# Patient Record
Sex: Female | Born: 1955 | Race: White | Hispanic: No | Smoking: Current some day smoker
Health system: Southern US, Community
[De-identification: ages and names within clinical notes are randomized; demographics above are authoritative.]

## PROBLEM LIST (undated history)

## (undated) DIAGNOSIS — R079 Chest pain, unspecified: Secondary | ICD-10-CM

## (undated) DIAGNOSIS — K219 Gastro-esophageal reflux disease without esophagitis: Secondary | ICD-10-CM

## (undated) DIAGNOSIS — E785 Hyperlipidemia, unspecified: Secondary | ICD-10-CM

## (undated) DIAGNOSIS — K760 Fatty (change of) liver, not elsewhere classified: Secondary | ICD-10-CM

## (undated) DIAGNOSIS — G43909 Migraine, unspecified, not intractable, without status migrainosus: Secondary | ICD-10-CM

## (undated) DIAGNOSIS — F419 Anxiety disorder, unspecified: Secondary | ICD-10-CM

## (undated) DIAGNOSIS — I10 Essential (primary) hypertension: Secondary | ICD-10-CM

## (undated) DIAGNOSIS — F32A Depression, unspecified: Secondary | ICD-10-CM

## (undated) DIAGNOSIS — F329 Major depressive disorder, single episode, unspecified: Secondary | ICD-10-CM

## (undated) DIAGNOSIS — N200 Calculus of kidney: Secondary | ICD-10-CM

## (undated) DIAGNOSIS — J449 Chronic obstructive pulmonary disease, unspecified: Secondary | ICD-10-CM

## (undated) HISTORY — DX: Major depressive disorder, single episode, unspecified: F32.9

## (undated) HISTORY — PX: TUBAL LIGATION: SHX77

## (undated) HISTORY — DX: Anxiety disorder, unspecified: F41.9

## (undated) HISTORY — PX: CORONARY ANGIOPLASTY: SHX604

## (undated) HISTORY — PX: EYE SURGERY: SHX253

## (undated) HISTORY — DX: Hyperlipidemia, unspecified: E78.5

## (undated) HISTORY — DX: Migraine, unspecified, not intractable, without status migrainosus: G43.909

## (undated) HISTORY — DX: Chest pain, unspecified: R07.9

## (undated) HISTORY — DX: Calculus of kidney: N20.0

## (undated) HISTORY — DX: Depression, unspecified: F32.A

## (undated) HISTORY — DX: Gastro-esophageal reflux disease without esophagitis: K21.9

## (undated) HISTORY — PX: TONSILLECTOMY: SUR1361

---

## 1998-07-15 ENCOUNTER — Ambulatory Visit (HOSPITAL_COMMUNITY): Admission: RE | Admit: 1998-07-15 | Discharge: 1998-07-15 | Payer: Self-pay | Admitting: Neurosurgery

## 2001-07-25 ENCOUNTER — Emergency Department (HOSPITAL_COMMUNITY): Admission: EM | Admit: 2001-07-25 | Discharge: 2001-07-25 | Payer: Self-pay | Admitting: Emergency Medicine

## 2001-07-25 ENCOUNTER — Encounter: Payer: Self-pay | Admitting: Emergency Medicine

## 2001-07-25 ENCOUNTER — Encounter: Payer: Self-pay | Admitting: *Deleted

## 2001-12-10 ENCOUNTER — Ambulatory Visit (HOSPITAL_COMMUNITY): Admission: RE | Admit: 2001-12-10 | Discharge: 2001-12-10 | Payer: Self-pay | Admitting: Family Medicine

## 2001-12-10 ENCOUNTER — Encounter: Payer: Self-pay | Admitting: Family Medicine

## 2002-01-06 ENCOUNTER — Encounter: Payer: Self-pay | Admitting: Family Medicine

## 2002-01-06 ENCOUNTER — Emergency Department (HOSPITAL_COMMUNITY): Admission: RE | Admit: 2002-01-06 | Discharge: 2002-01-06 | Payer: Self-pay | Admitting: Family Medicine

## 2002-03-04 ENCOUNTER — Encounter: Payer: Self-pay | Admitting: Urology

## 2002-03-04 ENCOUNTER — Ambulatory Visit (HOSPITAL_COMMUNITY): Admission: RE | Admit: 2002-03-04 | Discharge: 2002-03-04 | Payer: Self-pay | Admitting: Urology

## 2002-03-13 ENCOUNTER — Other Ambulatory Visit: Admission: RE | Admit: 2002-03-13 | Discharge: 2002-03-13 | Payer: Self-pay | Admitting: Dermatology

## 2002-07-08 ENCOUNTER — Ambulatory Visit (HOSPITAL_COMMUNITY): Admission: RE | Admit: 2002-07-08 | Discharge: 2002-07-08 | Payer: Self-pay | Admitting: Internal Medicine

## 2002-07-08 HISTORY — PX: COLONOSCOPY: SHX174

## 2003-04-10 ENCOUNTER — Encounter: Payer: Self-pay | Admitting: Family Medicine

## 2003-04-10 ENCOUNTER — Ambulatory Visit (HOSPITAL_COMMUNITY): Admission: RE | Admit: 2003-04-10 | Discharge: 2003-04-10 | Payer: Self-pay | Admitting: Family Medicine

## 2004-10-28 ENCOUNTER — Ambulatory Visit (HOSPITAL_COMMUNITY): Admission: RE | Admit: 2004-10-28 | Discharge: 2004-10-28 | Payer: Self-pay | Admitting: Family Medicine

## 2008-11-18 ENCOUNTER — Ambulatory Visit (HOSPITAL_COMMUNITY): Admission: RE | Admit: 2008-11-18 | Discharge: 2008-11-18 | Payer: Self-pay | Admitting: Family Medicine

## 2008-11-30 ENCOUNTER — Ambulatory Visit (HOSPITAL_COMMUNITY): Admission: RE | Admit: 2008-11-30 | Discharge: 2008-11-30 | Payer: Self-pay | Admitting: Family Medicine

## 2009-05-01 ENCOUNTER — Observation Stay (HOSPITAL_COMMUNITY): Admission: EM | Admit: 2009-05-01 | Discharge: 2009-05-03 | Payer: Self-pay | Admitting: Emergency Medicine

## 2009-05-10 ENCOUNTER — Ambulatory Visit (HOSPITAL_COMMUNITY): Admission: RE | Admit: 2009-05-10 | Discharge: 2009-05-10 | Payer: Self-pay | Admitting: Internal Medicine

## 2009-05-11 ENCOUNTER — Encounter (INDEPENDENT_AMBULATORY_CARE_PROVIDER_SITE_OTHER): Payer: Self-pay | Admitting: *Deleted

## 2009-07-14 ENCOUNTER — Ambulatory Visit: Payer: Self-pay | Admitting: Gastroenterology

## 2009-07-14 ENCOUNTER — Encounter: Payer: Self-pay | Admitting: Internal Medicine

## 2009-07-14 DIAGNOSIS — R0789 Other chest pain: Secondary | ICD-10-CM | POA: Insufficient documentation

## 2009-07-14 DIAGNOSIS — R1319 Other dysphagia: Secondary | ICD-10-CM | POA: Insufficient documentation

## 2009-07-14 DIAGNOSIS — K5909 Other constipation: Secondary | ICD-10-CM | POA: Insufficient documentation

## 2009-07-14 DIAGNOSIS — K222 Esophageal obstruction: Secondary | ICD-10-CM | POA: Insufficient documentation

## 2009-07-14 DIAGNOSIS — K219 Gastro-esophageal reflux disease without esophagitis: Secondary | ICD-10-CM | POA: Insufficient documentation

## 2009-07-15 ENCOUNTER — Encounter: Payer: Self-pay | Admitting: Internal Medicine

## 2009-07-21 ENCOUNTER — Ambulatory Visit: Payer: Self-pay | Admitting: Internal Medicine

## 2009-07-21 ENCOUNTER — Ambulatory Visit (HOSPITAL_COMMUNITY): Admission: RE | Admit: 2009-07-21 | Discharge: 2009-07-21 | Payer: Self-pay | Admitting: Internal Medicine

## 2009-07-21 HISTORY — PX: ESOPHAGOGASTRODUODENOSCOPY: SHX1529

## 2009-07-23 ENCOUNTER — Telehealth (INDEPENDENT_AMBULATORY_CARE_PROVIDER_SITE_OTHER): Payer: Self-pay

## 2009-09-17 ENCOUNTER — Ambulatory Visit (HOSPITAL_COMMUNITY): Admission: RE | Admit: 2009-09-17 | Discharge: 2009-09-17 | Payer: Self-pay | Admitting: Internal Medicine

## 2009-12-28 ENCOUNTER — Encounter (INDEPENDENT_AMBULATORY_CARE_PROVIDER_SITE_OTHER): Payer: Self-pay | Admitting: *Deleted

## 2010-01-06 ENCOUNTER — Ambulatory Visit: Payer: Self-pay | Admitting: Internal Medicine

## 2010-04-14 ENCOUNTER — Encounter: Payer: Self-pay | Admitting: Gastroenterology

## 2010-05-16 ENCOUNTER — Encounter: Payer: Self-pay | Admitting: Urgent Care

## 2011-01-24 NOTE — Letter (Signed)
Summary: Appointment Reminder  [facility]  [address]   [place], [place] [address]   Phone: [phone]  Fax: [phone]       December 28, 2009   Lorraine Wells [address] [place], [place]  [address] June 08, 1956    Dear [name],  We have been unable to reach you by phone to schedule a follow up   appointment that was recommended for you by Dr. Jena Gauss. It is very   important that we reach you to schedule an appointment. We hope that you  allow [place] to participate in your health care needs. Please contact us at  [phone] at your earliest convenience to schedule your appointment.  Sincerely,    [facility] R. Roetta Sessions, M.D.    Kassie Mends, M.D. Lorenza Burton, FNP-BC    Tana Coast, PA-C Phone: [phone]    Fax: [phone]

## 2011-01-24 NOTE — Medication Information (Signed)
Summary: Tax adviser   Imported By: Diana Eves 05/16/2010 11:52:30  _____________________________________________________________________  External Attachment:    Type:   Image     Comment:   External Document  Appended Document: RX FolderZEGERID    Prescriptions: ZEGERID 40-1100 MG CAPS (OMEPRAZOLE-SODIUM BICARBONATE) 1 by mouth before supper daily for GERD  #31 x 11   Entered and Authorized by:   Joselyn Arrow FNP-BC   Signed by:   Joselyn Arrow FNP-BC on 05/16/2010   Method used:   Electronically to        [facility] (retail)       [address] [place], [place]  [address]       Ph: [phone]       Fax: [phone]   RxID:   [id number]

## 2011-01-24 NOTE — Medication Information (Signed)
Summary: zegerid PA  zegerid PA   Imported By: Hendricks Limes LPN 16/09/9603 54:09:81  _____________________________________________________________________  External Attachment:    Type:   Image     Comment:   External Document

## 2011-01-24 NOTE — Assessment & Plan Note (Signed)
Summary: pp fu in 6 months/gerd/CM   Visit Type:  Follow-up Visit Primary Care Provider:  Mcgough  Chief Complaint:  follow up- doing ok.  History of Present Illness: 6 month followup EGD with Maloney dilation of a Schatzki's ring. [name] is been doing fairly well and Dexalant 60 mg daily  although she's is awakened from a sound sleep with nocturnal regurgitation on the order of once weekly;  patient states she is going to bed on an empty stomach.; she has gained 4 pounds since her last visit. She has gained 4 pounds. She continues to smoke.    Current Problems (verified): 1)  Constipation, Chronic  (ICD-564.09) 2)  Chest Pain, Atypical  (ICD-786.59) 3)  Schatzki's Ring  (ICD-530.3) 4)  Gerd  (ICD-530.81) 5)  Dysphagia  (ZOX-096.04)  Current Medications (verified): 1)  Norvasc 5 Mg Tabs (Amlodipine Besylate) .... Take 1 Tablet By Mouth Once A Day 2)  Hydrochlorothiazide 25 Mg Tabs (Hydrochlorothiazide) .... Take 1 Tablet By Mouth Once A Day 3)  Singulair 10 Mg Tabs (Montelukast Sodium) .... Take 1 Tablet By Mouth Once A Day 4)  Proair .... As Needed 5)  Dexilant 60 Mg Cpdr (Dexlansoprazole) .... One By Mouth Daily 30 Mins Before Breakfast  Allergies (verified): No Known Drug Allergies  Past History:  Past Medical History: Last updated: 07/14/2009 Asthma Depression GERD Hyperlipidemia Hypertension Kidney Stones Migraines Recent Cath for chest pain, unremarkable. TCS, Dr. Jena Gauss 2003, int hemorrhoids Bell's Palsy, H/O?  Past Surgical History: Last updated: 07/14/2009 Tonsillectomy Tubal Ligation  Family History: Last updated: 07/14/2009 Paternal Aunt - age 73, colon cancer  Social History: Last updated: 07/14/2009 Significant other of 13 years [employer] 1 son, 97 y/o 1ppd since age 72 Alcohol Use - no Illicit Drug Use - no  Vital Signs:  Patient profile:   55 year old female Height:      62 inches Weight:      146  pounds BMI:     26.80 Temp:     98.0 degrees F oral Pulse rate:   84 / minute BP sitting:   108 / 80  (left arm) Cuff size:   regular  Vitals Entered By: Hendricks Limes LPN (January 06, 2010 4:12 PM)  Physical Exam  General:  alert conversant no acute distress detailed exam deferred  Impression & Recommendations: Impression: A 55 year old lady with long-standing gastroesophageal reflux disease.  Dysphagia resolved status post dilation Schatzki's ring. She has significant breakthrough symptoms about once weekly.  I reviewed antireflux lifestyle/diet with her today in the office Recommendations:  stop Dexalant begin Zegerid 40 mg once daily just before suppertime.  Recommend smoking cessation. She will let us know via telephone how she's doing in a month with this new drug  - we will go from there. Plan to see her back in the office in one year.  Other Orders: Est. Patient Level III (54098)

## 2011-04-04 LAB — URINALYSIS, ROUTINE W REFLEX MICROSCOPIC
Bilirubin Urine: NEGATIVE
Glucose, UA: NEGATIVE mg/dL
Ketones, ur: NEGATIVE mg/dL
Nitrite: NEGATIVE
Specific Gravity, Urine: 1.013 (ref 1.005–1.030)
pH: 6.5 (ref 5.0–8.0)

## 2011-04-04 LAB — CBC
HCT: 40.4 % (ref 36.0–46.0)
Hemoglobin: 13.1 g/dL (ref 12.0–15.0)
Platelets: 261 10*3/uL (ref 150–400)
RBC: 4.71 MIL/uL (ref 3.87–5.11)
RDW: 15.2 % (ref 11.5–15.5)
WBC: 7.9 10*3/uL (ref 4.0–10.5)

## 2011-04-04 LAB — COMPREHENSIVE METABOLIC PANEL
ALT: 36 U/L — ABNORMAL HIGH (ref 0–35)
Alkaline Phosphatase: 81 U/L (ref 39–117)
BUN: 10 mg/dL (ref 6–23)
Creatinine, Ser: 0.75 mg/dL (ref 0.4–1.2)
Glucose, Bld: 99 mg/dL (ref 70–99)
Sodium: 142 mEq/L (ref 135–145)
Total Bilirubin: 0.7 mg/dL (ref 0.3–1.2)

## 2011-04-04 LAB — CARDIAC PANEL(CRET KIN+CKTOT+MB+TROPI)
CK, MB: 0.9 ng/mL (ref 0.3–4.0)
CK, MB: 1 ng/mL (ref 0.3–4.0)
Total CK: 35 U/L (ref 7–177)
Total CK: 36 U/L (ref 7–177)
Troponin I: 0.01 ng/mL (ref 0.00–0.06)

## 2011-04-04 LAB — LIPID PANEL: Triglycerides: 130 mg/dL (ref <150)

## 2011-04-04 LAB — DIFFERENTIAL
Basophils Relative: 1 % (ref 0–1)
Lymphs Abs: 2.7 10*3/uL (ref 0.7–4.0)
Monocytes Absolute: 0.4 10*3/uL (ref 0.1–1.0)
Monocytes Relative: 6 % (ref 3–12)
Neutrophils Relative %: 52 % (ref 43–77)

## 2011-04-04 LAB — PROTIME-INR
INR: 0.9 (ref 0.00–1.49)
Prothrombin Time: 12.7 seconds (ref 11.6–15.2)

## 2011-04-04 LAB — CK TOTAL AND CKMB (NOT AT ARMC): CK, MB: 0.8 ng/mL (ref 0.3–4.0)

## 2011-04-04 LAB — BASIC METABOLIC PANEL
BUN: 13 mg/dL (ref 6–23)
CO2: 26 mEq/L (ref 19–32)
Calcium: 9.3 mg/dL (ref 8.4–10.5)
Potassium: 3.4 mEq/L — ABNORMAL LOW (ref 3.5–5.1)
Sodium: 142 mEq/L (ref 135–145)

## 2011-04-04 LAB — TSH: TSH: 1.694 u[IU]/mL (ref 0.350–4.500)

## 2011-04-27 ENCOUNTER — Encounter (HOSPITAL_COMMUNITY): Payer: Self-pay

## 2011-04-27 ENCOUNTER — Other Ambulatory Visit (HOSPITAL_COMMUNITY): Payer: Self-pay | Admitting: Family Medicine

## 2011-04-27 ENCOUNTER — Ambulatory Visit (HOSPITAL_COMMUNITY)
Admission: RE | Admit: 2011-04-27 | Discharge: 2011-04-27 | Disposition: A | Discharge status: Home / Self Care | Payer: 59 | Source: Ambulatory Visit | Attending: Family Medicine | Admitting: Family Medicine

## 2011-04-27 DIAGNOSIS — Z01419 Encounter for gynecological examination (general) (routine) without abnormal findings: Secondary | ICD-10-CM

## 2011-04-27 DIAGNOSIS — R05 Cough: Secondary | ICD-10-CM | POA: Insufficient documentation

## 2011-04-27 DIAGNOSIS — F17200 Nicotine dependence, unspecified, uncomplicated: Secondary | ICD-10-CM | POA: Insufficient documentation

## 2011-04-27 DIAGNOSIS — R059 Cough, unspecified: Secondary | ICD-10-CM | POA: Insufficient documentation

## 2011-04-27 DIAGNOSIS — J45909 Unspecified asthma, uncomplicated: Secondary | ICD-10-CM | POA: Insufficient documentation

## 2011-04-27 DIAGNOSIS — Z139 Encounter for screening, unspecified: Secondary | ICD-10-CM

## 2011-04-27 HISTORY — DX: Essential (primary) hypertension: I10

## 2011-05-02 ENCOUNTER — Ambulatory Visit (HOSPITAL_COMMUNITY)
Admission: RE | Admit: 2011-05-02 | Discharge: 2011-05-02 | Disposition: A | Discharge status: Home / Self Care | Payer: 59 | Source: Ambulatory Visit | Attending: Family Medicine | Admitting: Family Medicine

## 2011-05-02 DIAGNOSIS — Z139 Encounter for screening, unspecified: Secondary | ICD-10-CM

## 2011-05-02 DIAGNOSIS — Z1231 Encounter for screening mammogram for malignant neoplasm of breast: Secondary | ICD-10-CM | POA: Insufficient documentation

## 2011-05-09 NOTE — Discharge Summary (Signed)
NAMEMARRIETTA, THUNDER NO.:  [id number]   MEDICAL RECORD NO.:  [id number]          PATIENT TYPE:  INP   LOCATION:  3735                         FACILITY:  [facility]   PHYSICIAN:  Antonieta Iba, MD   DATE OF BIRTH:  January 02, 1956   DATE OF ADMISSION:  05/01/2009  DATE OF DISCHARGE:  05/03/2009                               DISCHARGE SUMMARY   REASON FOR PROCEDURE:  Ms. Lorraine Wells is a pleasant 55 year old  woman with a lifetime history of smoking, hyperlipidemia, hypertension  who presents with chest discomfort with radiation.  She was evaluated in  the emergency room by Dr. Lynnea Ferrier who scheduled her for cardiac  catheterization, given her the risk factors.  She has had a negative  stress test in the past.   PROCEDURE DETAILS:  Risks and benefits of the procedure were discussed  with the patient and consent was obtained.  She was brought to the  cardiac catheterization lab and prepped and draped in the usual sterile  fashion.  The modified Seldinger technique was used to engage the  femoral artery.  A 5-French sheath was placed.  A 5-French Judkins left  #4 and a right #4 catheter were used to engage the left main and the RCA  ostium respectively.  Hand injections with contrast with cinematography  was used to record the coronary anatomy.  A 5-French pigtail catheter  was used across the aortic valve to the left ventricle and LV gram was  recorded.  The sheath was removed at the end of the case and manual  pressure was held and hemostasis obtained.  No complications were noted  at the time of this dictation.   Coronary anatomy:   Left main:  Left main is moderate-to-large size vessel that bifurcates  into the LAD and left circumflex.  There is no significant disease  noted.   Left anterior descending:  The LAD is a moderate-to-large size vessel  that extends to the apex.  There is a high diagonal branch, possibly a  ramus branch.  There is also another  proximal-to-mid smaller diagonal  branch that is bifurcating.  There is no significant disease noted in  the LAD or the diagonal vessels.   Left circumflex:  The left circumflex is a moderate-to-large size vessel  with 3 obtuse marginal branches.  There is no significant disease noted.   Right coronary artery:  RCA is a either co-dominant or non-dominant  vessel with a moderate size marginal branch and moderate sized PL with  small PDA.  There is no significant disease noted.   LV gram notable for ejection fraction greater than 55% with no focal  wall motion abnormalities.  No aortic stenosis, trace mitral  regurgitation, and no significant aortic insufficiency.   In summary, co-dominant coronary artery system or right dominant system  with no significant coronary artery disease, ejection fraction estimated  at greater than 55% with no focal wall motion abnormalities and no  significant  aortic stenosis or aortic insufficiency with trace mitral regurgitation.  Etiology of the patient's chest pain is uncertain and likely atypical  with chest  pain given to her normal coronary anatomy.  She will followup  in the clinic for routine post catheterization check within the next  week to 10 days.      Antonieta Iba, MD  Electronically Signed     TJG/MEDQ  D:  05/03/2009  T:  05/04/2009  Job:  [id number]

## 2011-05-09 NOTE — Op Note (Signed)
NAME:  Lorraine Wells, Lorraine Wells             ACCOUNT NO.:  [id number]   MEDICAL RECORD NO.:  [id number]          PATIENT TYPE:  AMB   LOCATION:  DAY                           FACILITY:  [facility]   PHYSICIAN:  R. Roetta Sessions, M.D. DATE OF BIRTH:  [birth year]   DATE OF PROCEDURE:  07/21/2009  DATE OF DISCHARGE:                                PROCEDURE NOTE   Esophagogastroduodenoscopy with [name] dilation, biopsy disruption of  Schatzki's ring.   INDICATIONS FOR PROCEDURE:  A 55 -year-old lady with long history of  gastroesophageal reflux disease, now with some breakthrough symptoms,  taking Protonix 40 mg before she goes to bed nightly.  She also  describes esophageal dysphagia to solids.  Recent barium esophagram  demonstrated nonobstructing Schatzki's ring.  EGD now being done.  Risks, benefits, alternatives and limitations have been discussed.  Potential for esophageal dilation reviewed.  Please see documentation in  medical record.   PROCEDURE NOTE:  O2 saturation, blood pressure, pulse, respirations were  monitored throughout entire procedure.   CONSCIOUS SEDATION:  Versed 5 mg IV, Demerol 100 grams IV in divided  doses.   INSTRUMENT:  Pentax video chip system.   FINDINGS:  Examination of the tubular esophagus revealed a Schatzki's  ring and a single 4-mm distal esophageal erosion.  There was no  Barrett's esophagus.  The EG junction was widely patent, although ring  was very much present.  EG junction easily traversed with scope.   Stomach:  Gastric cavity was empty, insufflated well with air.  Thorough  examination of the gastric mucosa including retroflexion in the proximal  stomach and esophagogastric junction demonstrated a small hiatal hernia.  She had 1-2 mm fundal gland polyps, multiple, which were not  manipulated.  Otherwise gastric mucosa appeared normal.  Pylorus was  patent, easily traversed.  Examination of the bulb and second portion  revealed no abnormalities.   THERAPEUTIC/DIAGNOSTIC MANEUVERS PERFORMED:  Scope was withdrawn.  A 56-  French Maloney dilator was passed to full insertion with ease.  A look  back revealed the ring remained intact after passage of the dilator.  Subsequently 2 quadrant bites of the ring with gentle biopsy forceps  disrupted this ring nicely without apparent complications.  There was  minimal bleeding.  The patient tolerated the procedure well and as  reactive in endoscopy.   IMPRESSION:  1. Schatzki's ring status post dilation disruption as described above.  2. Single distal esophageal erosion consistent with mild erosive      reflux esophagitis; otherwise unremarkable esophagus.  3. Small hiatal hernia.  4. Multiple fundal gland type polyps not manipulated; otherwise normal      gastric mucosa.  5. Patent pylorus.  6. Normal D1, D2.   RECOMMENDATIONS:  1. Gastroesophageal reflux disease and hiatal hernia literature      provided to Ms. Merilynn Finland.  2. Increase Protonix to 40 grams orally twice daily, i.e. before      supper and breakfast for 3 months, thereafter drop back to Protonix      once daily before supper.   I plan to see this nice lady back in  6 months.      Jonathon Bellows, M.D.  Electronically Signed     RMR/MEDQ  D:  07/21/2009  T:  07/21/2009  Job:  [id number]   cc:   Kirk Ruths, M.D.  Fax: [phone]

## 2011-05-09 NOTE — Discharge Summary (Signed)
NAMERAYNISHA, AVILLA NO.:  [id number]   MEDICAL RECORD NO.:  [id number]          PATIENT TYPE:  INP   LOCATION:  3735                         FACILITY:  [facility]   PHYSICIAN:  Ritta Slot, MD     DATE OF BIRTH:  Dec 14, 1956   DATE OF ADMISSION:  05/01/2009  DATE OF DISCHARGE:  05/03/2009                               DISCHARGE SUMMARY   DISCHARGE DIAGNOSES:  1. Chest pain, normal coronaries at catheterization this admission.  2. History of smoking.  3. Treated hypertension.  4. Dyslipidemia.   HOSPITAL COURSE:  The patient is a 55 year old female who was sent down  from Dr. Sharyon Medicus office with chest pain.  She had some minor nonspecific  ST changes.  She does have risk factors for coronary disease.  She was  admitted to telemetry and CK-MB troponins were obtained.  She was put on  beta-blocker and aspirin and set up for diagnostic catheterization which  was done today, May 03, 2009.  Her catheterization shows essentially  normal coronaries.   LABORATORY DATA:  Other labs show an LDL of 123, cholesterol of 180,  hemoglobin A1c of 5.9.  White count 7.9, hemoglobin 13.9, hematocrit  40.4, and platelets 261.  Sodium 142, potassium 3.4, BUN 15, and  creatinine 0.9.  Chest x-ray shows no active lung disease with some  hyperaeration.   DISPOSITION:  The patient is discharged in stable condition.  She will  follow up with Dr. Lynnea Ferrier in [place] in about a week.   DISCHARGE MEDICATIONS:  1. Hydrochlorothiazide 12.5 mg a day.  2. Norvasc 5 mg a day.  3. Protonix 40 mg twice a day.  4. Singulair 10 mg a day.  5. Albuterol p.r.n.   DISCHARGE INSTRUCTIONS:  She has been instructed to stop smoking.  We  will try and get the smoking cessation counselor to see her before  discharge.      Abelino Derrick, P.A.      Ritta Slot, MD  Electronically Signed    LKK/MEDQ  D:  05/03/2009  T:  05/04/2009  Job:  [id number]   cc:   Madelin Rear. Sherwood Gambler, MD

## 2011-05-12 NOTE — Op Note (Signed)
[facility]  Patient:    Lorraine Wells, Lorraine Wells Visit Number: [id number] MRN: [id number]          Service Type: END Location: DAY Attending Physician:  Jonathon Bellows Dictated by:   Roetta Sessions, M.D. Proc. Date: 07/08/02 Admit Date:  07/08/2002 Discharge Date: 07/08/2002   CC:         Colette Ribas, M.D.   Operative Report  PROCEDURE:  Diagnostic colonoscopy.  INDICATIONS:  The patient is a 55 year old lady with recent abdominal cramps and hematochezia which resolved.  This occurred for a couple of days approximately two weeks ago.  She now comes for colonoscopy.  She currently is devoid of any lower GI tract symptoms and moves her bowels daily.  She has not seen any more blood per rectum.  There is no family history of colorectal cancer.  She does smoke.  Colonoscopy is now being done to further evaluate her symptoms.  This approach has been discussed with the patient at length.  Potential risks, benefits, and alternatives have been reviewed with questions answered.  Please see my July 03, 2002, dictation for more information.  GASTROENTEROLOGIST:  Roetta Sessions, M.D.  PROCEDURE NOTE:  O2 saturation, blood pressure, pulses of this patient were monitored throughout the entire procedure.  CONSCIOUS SEDATION:  Versed 4 mg IV, Demerol 100 mg IV in divided doses.  INSTRUMENT: Olympus pediatric colonoscope.  FINDINGS:  Digital rectal examination revealed no abnormalities.  ENDOSCOPIC FINDINGS:  Prep was good.  RECTUM:  Examination of rectal mucosa including retroflexed view of the anal verge revealed minimal internal hemorrhoids.  Rectal mucosa otherwise appeared normal.  COLON:  Colonic mucosa was surveyed from the rectosigmoid junction through the left transverse right colon to the area of the appendiceal orifice, ileocecal valve, and cecum.  These structures were well seen and photographed.  Although there were some difficulties with  the light source not being as bright as it should be, mucosa was seen well, and the colonic mucosa all the way to the cecum appeared normal.  From the level of the cecum and ileocecal valve, the scope was slowly withdrawn.  All previously mentioned mucosal surfaces were again seen.  Again, no other abnormalities were observed.  The patient tolerated the procedure well and was reacted in endoscopy.  IMPRESSION: 1. Minimal internal hemorrhoids. 2. Otherwise normal rectum. 3. Normal colon.  I expect this patient has suffered a self-limiting bout of ischemic or segmental colitis.  RECOMMENDATIONS: 1. No specific further evaluation is needed unless she has recurrent symptoms. 2. The patient should stop smoking. 3. Unless something comes up in the future, she should consider returning    in 10 years for screening colonoscopy. Dictated by:   Roetta Sessions, M.D. Attending Physician:  Jonathon Bellows DD:  07/08/02 TD:  07/10/02 Job: [id number]

## 2011-12-12 ENCOUNTER — Encounter: Payer: Self-pay | Admitting: Internal Medicine

## 2012-06-20 LAB — COMPREHENSIVE METABOLIC PANEL
Albumin: 4.3
BUN: 11 mg/dL (ref 4–21)
Calcium: 9.4 mg/dL
Creat: 0.81

## 2012-06-20 LAB — CBC WITH DIFFERENTIAL/PLATELET
HCT: 39 %
WBC: 6.2
platelet count: 280

## 2012-07-24 ENCOUNTER — Other Ambulatory Visit (HOSPITAL_COMMUNITY): Payer: Self-pay | Admitting: Family Medicine

## 2012-07-24 DIAGNOSIS — Z139 Encounter for screening, unspecified: Secondary | ICD-10-CM

## 2012-07-24 DIAGNOSIS — Z01419 Encounter for gynecological examination (general) (routine) without abnormal findings: Secondary | ICD-10-CM

## 2012-07-25 ENCOUNTER — Ambulatory Visit (HOSPITAL_COMMUNITY)
Admission: RE | Admit: 2012-07-25 | Discharge: 2012-07-25 | Disposition: A | Discharge status: Home / Self Care | Payer: 59 | Source: Ambulatory Visit | Attending: Family Medicine | Admitting: Family Medicine

## 2012-07-25 DIAGNOSIS — Z01419 Encounter for gynecological examination (general) (routine) without abnormal findings: Secondary | ICD-10-CM

## 2012-07-25 DIAGNOSIS — Z1231 Encounter for screening mammogram for malignant neoplasm of breast: Secondary | ICD-10-CM | POA: Insufficient documentation

## 2012-07-25 DIAGNOSIS — Z139 Encounter for screening, unspecified: Secondary | ICD-10-CM

## 2012-07-29 ENCOUNTER — Telehealth: Payer: Self-pay

## 2012-07-29 NOTE — Telephone Encounter (Signed)
(   Last colonoscopy was 07/08/2002 and was normal). LMOM to call.

## 2012-07-30 ENCOUNTER — Telehealth: Payer: Self-pay | Admitting: *Deleted

## 2012-07-30 NOTE — Telephone Encounter (Signed)
Lorraine Wells received a letter to schedule a colonoscopy. Please call her back. Thanks.

## 2012-07-30 NOTE — Telephone Encounter (Signed)
Pt scheduled for OV with Tana Coast, PA on 08/05/2012 @ 8:00 AM for abdominal pain.

## 2012-08-02 ENCOUNTER — Encounter: Payer: Self-pay | Admitting: Internal Medicine

## 2012-08-05 ENCOUNTER — Encounter: Payer: Self-pay | Admitting: Gastroenterology

## 2012-08-05 ENCOUNTER — Ambulatory Visit (INDEPENDENT_AMBULATORY_CARE_PROVIDER_SITE_OTHER): Payer: 59 | Admitting: Gastroenterology

## 2012-08-05 VITALS — BP 140/90 | HR 84 | Temp 98.1°F | Ht 62.5 in | Wt 149.0 lb

## 2012-08-05 DIAGNOSIS — K219 Gastro-esophageal reflux disease without esophagitis: Secondary | ICD-10-CM

## 2012-08-05 DIAGNOSIS — Z1211 Encounter for screening for malignant neoplasm of colon: Secondary | ICD-10-CM

## 2012-08-05 DIAGNOSIS — K5909 Other constipation: Secondary | ICD-10-CM

## 2012-08-05 DIAGNOSIS — R1013 Epigastric pain: Secondary | ICD-10-CM

## 2012-08-05 MED ORDER — PEG-KCL-NACL-NASULF-NA ASC-C 100 G PO SOLR: 1.0000 | ORAL | Status: DC

## 2012-08-05 NOTE — Assessment & Plan Note (Signed)
4 week history of epigastric discomfort on a daily basis. No significant NSAID use. She takes a daily aspirin. She is on chronic the Zegerid for GERD with good control of typical heartburn symptoms. Symptoms are occurring postprandially especially after lunch which is her biggest meal of the day. Gallbladder remains in situ. I have offered her an upper endoscopy for further evaluation. If this is negative, consider gallbladder workup.   I have discussed the risks, alternatives, benefits with regards to but not limited to the risk of reaction to medication, bleeding, infection, perforation and the patient is agreeable to proceed. Written consent to be obtained.

## 2012-08-05 NOTE — Assessment & Plan Note (Signed)
Chronic constipation, managed with daily fiber. No other significant symptoms. Due for screening colonoscopy. She has a family history of colon cancer in a second-degree relative greater than age of 44, therefore this does not increase her personal risk of colon cancer.  I have discussed the risks, alternatives, benefits with regards to but not limited to the risk of reaction to medication, bleeding, infection, perforation and the patient is agreeable to proceed. Written consent to be obtained.

## 2012-08-05 NOTE — Patient Instructions (Signed)
We have scheduled you for an upper endoscopy and colonoscopy with Dr. Rourk. Please see separate instructions.  

## 2012-08-05 NOTE — Progress Notes (Signed)
Faxed to PCP

## 2012-08-05 NOTE — Progress Notes (Signed)
Primary Care Physician:  GOLDING,JOHN CABOT, MD  Primary Gastroenterologist:  Michael Rourk, MD   Chief Complaint  Patient presents with  . Colonoscopy  . EGD    HPI:  Lorraine Wells is a 56 y.o. female here to schedule her screening colonoscopy. She also has a four week h/o epigastric discomfort almost daily. Seems to be worse after lunch. Biggest meal of day. No n/v. Some occasional heartburn. No recent change in her PPI. BM irregular. Added fiber, powder. No melena, brbpr. No weight loss. Occasional ibuprofen for headache but not regularly. No dysphagia.   Current Outpatient Prescriptions  Medication Sig Dispense Refill  . amLODipine (NORVASC) 5 MG tablet Take 5 mg by mouth daily.       . aspirin 81 MG tablet Take 81 mg by mouth daily.      . clonazePAM (KLONOPIN) 0.5 MG tablet Take 0.5 mg by mouth 2 (two) times daily as needed.       . hydrochlorothiazide (HYDRODIURIL) 25 MG tablet Take 25 mg by mouth daily.       . montelukast (SINGULAIR) 10 MG tablet Take 10 mg by mouth at bedtime.       . PROAIR HFA 108 (90 BASE) MCG/ACT inhaler Inhale 1 puff into the lungs every 4 (four) hours as needed.       . simvastatin (ZOCOR) 10 MG tablet Take 10 mg by mouth at bedtime.       . ZEGERID 40-1100 MG per capsule Take 1 capsule by mouth daily before breakfast.         Allergies as of 08/05/2012  . (No Known Allergies)    Past Medical History  Diagnosis Date  . Hypertension   . Asthma   . Anxiety   . Kidney stones   . Depression   . GERD (gastroesophageal reflux disease)   . Migraine   . Hyperlipidemia     Past Surgical History  Procedure Date  . Colonoscopy 07/08/02    minimal internal hemorrhoids/otherwise normal  . Esophagogastroduodenoscopy 07/21/09    schatzis ring s/p dilation;small HH/mild erosive reflux/patent pylorus  . Coronary angioplasty   . Tubal ligation   . Tonsillectomy     Family History  Problem Relation Age of Onset  . Colon cancer Paternal Aunt 66  .  Liver disease Neg Hx   . Inflammatory bowel disease Neg Hx     History   Social History  . Marital Status: Divorced    Spouse Name: N/A    Number of Children: 1  . Years of Education: N/A   Occupational History  .  [employer]   Social History Main Topics  . Smoking status: Current Some Day Smoker -- 0.3 packs/day    Types: Cigarettes  . Smokeless tobacco: Not on file  . Alcohol Use: No  . Drug Use: No  . Sexually Active: Not on file   Other Topics Concern  . Not on file   Social History Narrative  . No narrative on file      ROS:  General: Negative for anorexia, weight loss, fever, chills, fatigue, weakness. Eyes: Negative for vision changes.  ENT: Negative for hoarseness, difficulty swallowing , nasal congestion. CV: Negative for chest pain, angina, palpitations, dyspnea on exertion, peripheral edema.  Respiratory: Negative for dyspnea at rest, dyspnea on exertion, cough, sputum, wheezing.  GI: See history of present illness. GU:  Negative for dysuria, hematuria, urinary incontinence, urinary frequency, nocturnal urination.  MS: Negative for joint pain, low   back pain.  Derm: Negative for rash or itching.  Neuro: Negative for weakness, abnormal sensation, seizure, frequent headaches, memory loss, confusion.  Psych: Negative for anxiety, depression, suicidal ideation, hallucinations.  Endo: Negative for unusual weight change.  Heme: Negative for bruising or bleeding. Allergy: Negative for rash or hives.    Physical Examination:  BP 140/90  Pulse 84  Temp 98.1 F (36.7 C) (Temporal)  Ht 5' 2.5" (1.588 m)  Wt 149 lb (67.586 kg)  BMI 26.82 kg/m2   General: Well-nourished, well-developed in no acute distress.  Head: Normocephalic, atraumatic.   Eyes: Conjunctiva pink, no icterus. Mouth: Oropharyngeal mucosa moist and pink , no lesions erythema or exudate. Neck: Supple without thyromegaly, masses, or lymphadenopathy.  Lungs: Clear to auscultation  bilaterally.  Heart: Regular rate and rhythm, no murmurs rubs or gallops.  Abdomen: Bowel sounds are normal, nontender, nondistended, no hepatosplenomegaly or masses, no abdominal bruits or    hernia , no rebound or guarding.   Rectal: defer to time of TCS Extremities: No lower extremity edema. No clubbing or deformities.  Neuro: Alert and oriented x 4 , grossly normal neurologically.  Skin: Warm and dry, no rash or jaundice.   Psych: Alert and cooperative, normal mood and affect.   

## 2012-08-07 NOTE — Telephone Encounter (Signed)
Pt has had OV and is scheduled for colonoscopy on 08/27/2012 with RMR.

## 2012-08-16 ENCOUNTER — Encounter (HOSPITAL_COMMUNITY): Payer: Self-pay | Admitting: Pharmacy Technician

## 2012-08-23 MED ORDER — SODIUM CHLORIDE 0.9 % IV SOLN: INTRAVENOUS | Status: DC

## 2012-08-27 ENCOUNTER — Encounter (HOSPITAL_COMMUNITY)
Admission: RE | Disposition: A | Discharge status: Home Health | Payer: Self-pay | Source: Ambulatory Visit | Attending: Internal Medicine

## 2012-08-27 ENCOUNTER — Ambulatory Visit (HOSPITAL_COMMUNITY)
Admission: RE | Admit: 2012-08-27 | Discharge: 2012-08-27 | Disposition: A | Discharge status: Home Health | Payer: 59 | Source: Ambulatory Visit | Attending: Internal Medicine | Admitting: Internal Medicine

## 2012-08-27 ENCOUNTER — Encounter (HOSPITAL_COMMUNITY): Payer: Self-pay

## 2012-08-27 ENCOUNTER — Other Ambulatory Visit: Payer: Self-pay | Admitting: General Practice

## 2012-08-27 DIAGNOSIS — Z79899 Other long term (current) drug therapy: Secondary | ICD-10-CM | POA: Insufficient documentation

## 2012-08-27 DIAGNOSIS — D128 Benign neoplasm of rectum: Secondary | ICD-10-CM | POA: Insufficient documentation

## 2012-08-27 DIAGNOSIS — Z1211 Encounter for screening for malignant neoplasm of colon: Secondary | ICD-10-CM | POA: Insufficient documentation

## 2012-08-27 DIAGNOSIS — K449 Diaphragmatic hernia without obstruction or gangrene: Secondary | ICD-10-CM | POA: Insufficient documentation

## 2012-08-27 DIAGNOSIS — K5730 Diverticulosis of large intestine without perforation or abscess without bleeding: Secondary | ICD-10-CM | POA: Insufficient documentation

## 2012-08-27 DIAGNOSIS — I10 Essential (primary) hypertension: Secondary | ICD-10-CM | POA: Insufficient documentation

## 2012-08-27 DIAGNOSIS — D129 Benign neoplasm of anus and anal canal: Secondary | ICD-10-CM

## 2012-08-27 DIAGNOSIS — R109 Unspecified abdominal pain: Secondary | ICD-10-CM

## 2012-08-27 DIAGNOSIS — K5909 Other constipation: Secondary | ICD-10-CM

## 2012-08-27 DIAGNOSIS — D131 Benign neoplasm of stomach: Secondary | ICD-10-CM | POA: Insufficient documentation

## 2012-08-27 DIAGNOSIS — E785 Hyperlipidemia, unspecified: Secondary | ICD-10-CM | POA: Insufficient documentation

## 2012-08-27 DIAGNOSIS — R1013 Epigastric pain: Secondary | ICD-10-CM

## 2012-08-27 DIAGNOSIS — K219 Gastro-esophageal reflux disease without esophagitis: Secondary | ICD-10-CM

## 2012-08-27 HISTORY — PX: COLONOSCOPY: SHX5424

## 2012-08-27 HISTORY — PX: ESOPHAGOGASTRODUODENOSCOPY: SHX1529

## 2012-08-27 SURGERY — COLONOSCOPY WITH ESOPHAGOGASTRODUODENOSCOPY (EGD)
Anesthesia: Moderate Sedation

## 2012-08-27 MED ORDER — MIDAZOLAM HCL 5 MG/5ML IJ SOLN
INTRAMUSCULAR | Status: AC
  Filled 2012-08-27: qty 10

## 2012-08-27 MED ORDER — MEPERIDINE HCL 100 MG/ML IJ SOLN
INTRAMUSCULAR | Status: DC | PRN
  Administered 2012-08-27: 50 mg via INTRAVENOUS
  Administered 2012-08-27 (×2): 25 mg via INTRAVENOUS

## 2012-08-27 MED ORDER — SIMETHICONE 40 MG/0.6ML PO SUSP
ORAL | Status: DC | PRN
  Administered 2012-08-27: 11:00:00

## 2012-08-27 MED ORDER — BUTAMBEN-TETRACAINE-BENZOCAINE 2-2-14 % EX AERO
INHALATION_SPRAY | CUTANEOUS | Status: DC | PRN
  Administered 2012-08-27: 2 via TOPICAL

## 2012-08-27 MED ORDER — MIDAZOLAM HCL 5 MG/5ML IJ SOLN
INTRAMUSCULAR | Status: DC | PRN
  Administered 2012-08-27 (×2): 1 mg via INTRAVENOUS
  Administered 2012-08-27 (×2): 2 mg via INTRAVENOUS

## 2012-08-27 MED ORDER — SODIUM CHLORIDE 0.45 % IV SOLN
INTRAVENOUS | Status: DC
  Administered 2012-08-27: 1000 mL via INTRAVENOUS

## 2012-08-27 MED ORDER — MEPERIDINE HCL 100 MG/ML IJ SOLN
INTRAMUSCULAR | Status: AC
  Filled 2012-08-27: qty 2

## 2012-08-27 NOTE — Interval H&P Note (Signed)
History and Physical Interval Note:  08/27/2012 11:02 AM  Lorraine Wells  has presented today for surgery, with the diagnosis of GERD, EPIGASTRIC PAIN , CONSTIPATION  The various methods of treatment have been discussed with the patient and family. After consideration of risks, benefits and other options for treatment, the patient has consented to  Procedure(s) (LRB) with comments: COLONOSCOPY WITH ESOPHAGOGASTRODUODENOSCOPY (EGD) (N/A) - 10:30 as a surgical intervention .  The patient's history has been reviewed, patient examined, no change in status, stable for surgery.  I have reviewed the patient's chart and labs.  Questions were answered to the patient's satisfaction.     Eula Listen

## 2012-08-27 NOTE — H&P (View-Only) (Signed)
Primary Care Physician:  Colette Ribas, MD  Primary Gastroenterologist:  Roetta Sessions, MD   Chief Complaint  Patient presents with  . Colonoscopy  . EGD    HPI:  Lorraine Wells is a 56 y.o. female here to schedule her screening colonoscopy. She also has a four week h/o epigastric discomfort almost daily. Seems to be worse after lunch. Biggest meal of day. No n/v. Some occasional heartburn. No recent change in her PPI. BM irregular. Added fiber, powder. No melena, brbpr. No weight loss. Occasional ibuprofen for headache but not regularly. No dysphagia.   Current Outpatient Prescriptions  Medication Sig Dispense Refill  . amLODipine (NORVASC) 5 MG tablet Take 5 mg by mouth daily.       Marland Kitchen aspirin 81 MG tablet Take 81 mg by mouth daily.      . clonazePAM (KLONOPIN) 0.5 MG tablet Take 0.5 mg by mouth 2 (two) times daily as needed.       . hydrochlorothiazide (HYDRODIURIL) 25 MG tablet Take 25 mg by mouth daily.       . montelukast (SINGULAIR) 10 MG tablet Take 10 mg by mouth at bedtime.       Marland Kitchen PROAIR HFA 108 (90 BASE) MCG/ACT inhaler Inhale 1 puff into the lungs every 4 (four) hours as needed.       . simvastatin (ZOCOR) 10 MG tablet Take 10 mg by mouth at bedtime.       Marland Kitchen ZEGERID 40-1100 MG per capsule Take 1 capsule by mouth daily before breakfast.         Allergies as of 08/05/2012  . (No Known Allergies)    Past Medical History  Diagnosis Date  . Hypertension   . Asthma   . Anxiety   . Kidney stones   . Depression   . GERD (gastroesophageal reflux disease)   . Migraine   . Hyperlipidemia     Past Surgical History  Procedure Date  . Colonoscopy 07/08/02    minimal internal hemorrhoids/otherwise normal  . Esophagogastroduodenoscopy 07/21/09    schatzis ring s/p dilation;small HH/mild erosive reflux/patent pylorus  . Coronary angioplasty   . Tubal ligation   . Tonsillectomy     Family History  Problem Relation Age of Onset  . Colon cancer Paternal Aunt 35  .  Liver disease Neg Hx   . Inflammatory bowel disease Neg Hx     History   Social History  . Marital Status: Divorced    Spouse Name: N/A    Number of Children: 1  . Years of Education: N/A   Occupational History  .  [employer]   Social History Main Topics  . Smoking status: Current Some Day Smoker -- 0.3 packs/day    Types: Cigarettes  . Smokeless tobacco: Not on file  . Alcohol Use: No  . Drug Use: No  . Sexually Active: Not on file   Other Topics Concern  . Not on file   Social History Narrative  . No narrative on file      ROS:  General: Negative for anorexia, weight loss, fever, chills, fatigue, weakness. Eyes: Negative for vision changes.  ENT: Negative for hoarseness, difficulty swallowing , nasal congestion. CV: Negative for chest pain, angina, palpitations, dyspnea on exertion, peripheral edema.  Respiratory: Negative for dyspnea at rest, dyspnea on exertion, cough, sputum, wheezing.  GI: See history of present illness. GU:  Negative for dysuria, hematuria, urinary incontinence, urinary frequency, nocturnal urination.  MS: Negative for joint pain, low  back pain.  Derm: Negative for rash or itching.  Neuro: Negative for weakness, abnormal sensation, seizure, frequent headaches, memory loss, confusion.  Psych: Negative for anxiety, depression, suicidal ideation, hallucinations.  Endo: Negative for unusual weight change.  Heme: Negative for bruising or bleeding. Allergy: Negative for rash or hives.    Physical Examination:  BP 140/90  Pulse 84  Temp 98.1 F (36.7 C) (Temporal)  Ht 5' 2.5" (1.588 m)  Wt 149 lb (67.586 kg)  BMI 26.82 kg/m2   General: Well-nourished, well-developed in no acute distress.  Head: Normocephalic, atraumatic.   Eyes: Conjunctiva pink, no icterus. Mouth: Oropharyngeal mucosa moist and pink , no lesions erythema or exudate. Neck: Supple without thyromegaly, masses, or lymphadenopathy.  Lungs: Clear to auscultation  bilaterally.  Heart: Regular rate and rhythm, no murmurs rubs or gallops.  Abdomen: Bowel sounds are normal, nontender, nondistended, no hepatosplenomegaly or masses, no abdominal bruits or    hernia , no rebound or guarding.   Rectal: defer to time of TCS Extremities: No lower extremity edema. No clubbing or deformities.  Neuro: Alert and oriented x 4 , grossly normal neurologically.  Skin: Warm and dry, no rash or jaundice.   Psych: Alert and cooperative, normal mood and affect.

## 2012-08-27 NOTE — Op Note (Signed)
[facility] [address] [place], [address]   COLONOSCOPY PROCEDURE REPORT  PATIENT: Lorraine, Wells  MR#:         [id number] BIRTHDATE: 02/14/1956 , 56  yrs. old GENDER: Female ENDOSCOPIST: R.  Roetta Sessions, MD FACP FACG REFERRED BY:  Carmell Austria, M.D. PROCEDURE DATE:  08/27/2012 PROCEDURE:     colonoscopy with biopsy  INDICATIONS: average risk colorectal cancer screening  INFORMED CONSENT:  The risks, benefits, alternatives and imponderables including but not limited to bleeding, perforation as well as the possibility of a missed lesion have been reviewed.  The potential for biopsy, lesion removal, etc. have also been discussed.  Questions have been answered.  All parties agreeable. Please see the history and physical in the medical record for more information.  MEDICATIONS: Versed 6 mg IV and Demerol 100 mg IV in divided doses  DESCRIPTION OF PROCEDURE:  After a digital rectal exam was performed, the EG-[id number] ([id number]) and Pentax Colonoscope [id number] colonoscope was advanced from the anus through the rectum and colon to the area of the cecum, ileocecal valve and appendiceal orifice. The cecum was deeply intubated.  These structures were well-seen and photographed for the record.  From the level of the cecum and ileocecal valve, the scope was slowly and cautiously withdrawn. The mucosal surfaces were carefully surveyed utilizing scope tip deflection to facilitate fold flattening as needed.  The scope was pulled down into the rectum where a thorough examination was performed.    FINDINGS:  adequate preparation. Noncompliant left colon. Tortuosity of the remainder of the colon. Scattered left-sided diverticula; remainder of colonic mucosa appeared normal. Rectum small. Unable to retroflex. Rectal mucosa seen well on-face. 2 diminutive polyps seen at the rectosigmoid junction.  THERAPEUTIC / DIAGNOSTIC MANEUVERS PERFORMED:  the  above-mentioned polyps were cold biopsied/removed  COMPLICATIONS: none  CECAL WITHDRAWAL TIME:  11 minutes  IMPRESSION:  Diminutive rectosigmoid polyps-removed as described above. Left-sided diverticulosis  RECOMMENDATIONS: followup on pathology. See EGD report.   _______________________________ eSigned:  R. Roetta Sessions, MD FACP [facility] 08/27/2012 12:09 PM   CC:

## 2012-08-27 NOTE — Op Note (Signed)
[facility] [address] [place], [address]   ENDOSCOPY PROCEDURE REPORT  PATIENT: Lorraine, Wells  MR#: [id number] BIRTHDATE: 1956-04-16 , 56  yrs. old GENDER: Female ENDOSCOPIST:  Roetta Sessions, MD FACP [facility] REFERRED BY:  Assunta Found, M.D. PROCEDURE DATE:  08/27/2012 PROCEDURE:     EGD with gastric biopsy  INDICATIONS:     epigastric pain  INFORMED CONSENT:   The risks, benefits, limitations, alternatives and imponderables have been discussed.  The potential for biopsy, esophogeal dilation, etc. have also been reviewed.  Questions have been answered.  All parties agreeable.  Please see the history and physical in the medical record for more information.  MEDICATIONS:     Versed 4 mg IV 75 mg IV in divided doses. Cetacaine spray  DESCRIPTION OF PROCEDURE:   The [id number] ([id number])  endoscope was introduced through the mouth and advanced to the second portion of the duodenum without difficulty or limitations.  The mucosal surfaces were surveyed very carefully during advancement of the scope and upon withdrawal.  Retroflexion view of the proximal stomach and esophagogastric junction was performed.      FINDINGS: normal, patent tubular esophagus.   3 cm hiatal hernia. Stomach empty. Multiple fundal gland type polyps present. No ulcer or infiltrating process or other abnormality. Pylorus patent. Normal first and second portion of the duodenum  THERAPEUTIC / DIAGNOSTIC MANEUVERS PERFORMED:  One of the gastricpolyps was biopsied  COMPLICATIONS:  None  IMPRESSION:   Hiatal hernia. Gastric polyps-status post biopsy to  RECOMMENDATIONS: Followup on  pathology.  Will evaluate patient for potential gallbladder etiology in the near future.   _______________________________ R. Roetta Sessions, MD FACP [facility] eSigned:  R. Roetta Sessions, MD FACP [facility] 08/27/2012 11:27 AM     CC:

## 2012-08-29 ENCOUNTER — Ambulatory Visit (HOSPITAL_COMMUNITY)
Admit: 2012-08-29 | Discharge: 2012-08-29 | Disposition: A | Discharge status: Home / Self Care | Payer: 59 | Source: Ambulatory Visit | Attending: Internal Medicine | Admitting: Internal Medicine

## 2012-08-29 DIAGNOSIS — K7689 Other specified diseases of liver: Secondary | ICD-10-CM | POA: Insufficient documentation

## 2012-08-29 DIAGNOSIS — R1013 Epigastric pain: Secondary | ICD-10-CM | POA: Insufficient documentation

## 2012-08-29 DIAGNOSIS — R109 Unspecified abdominal pain: Secondary | ICD-10-CM

## 2012-09-01 ENCOUNTER — Encounter: Payer: Self-pay | Admitting: Internal Medicine

## 2012-09-02 ENCOUNTER — Telehealth: Payer: Self-pay | Admitting: *Deleted

## 2012-09-02 ENCOUNTER — Encounter: Payer: Self-pay | Admitting: *Deleted

## 2012-09-02 NOTE — Telephone Encounter (Signed)
Ms Foglio called today wanting to know the results of her tests from last week. Please call her back. Thanks.

## 2012-09-02 NOTE — Telephone Encounter (Signed)
Called her and she is aware of results

## 2012-09-03 ENCOUNTER — Encounter: Payer: Self-pay | Admitting: Internal Medicine

## 2012-09-04 ENCOUNTER — Other Ambulatory Visit: Payer: Self-pay

## 2012-09-04 ENCOUNTER — Encounter: Payer: Self-pay | Admitting: Gastroenterology

## 2012-09-04 ENCOUNTER — Ambulatory Visit (INDEPENDENT_AMBULATORY_CARE_PROVIDER_SITE_OTHER): Payer: 59 | Admitting: Gastroenterology

## 2012-09-04 VITALS — BP 143/88 | HR 87 | Temp 98.4°F | Ht 62.0 in | Wt 146.2 lb

## 2012-09-04 DIAGNOSIS — R1013 Epigastric pain: Secondary | ICD-10-CM

## 2012-09-04 DIAGNOSIS — R932 Abnormal findings on diagnostic imaging of liver and biliary tract: Secondary | ICD-10-CM

## 2012-09-04 DIAGNOSIS — Z139 Encounter for screening, unspecified: Secondary | ICD-10-CM

## 2012-09-04 LAB — CREATININE, SERUM: Creat: 0.78 mg/dL (ref 0.50–1.10)

## 2012-09-04 NOTE — Assessment & Plan Note (Signed)
Abdominal pain, persistent but less frequent. No typical heartburn. Symptoms are postprandially especially after large meal. Gallbladder normal on recent abdominal ultrasound. Cannot exclude biliary dyskinesia. Discussed with patient, may ultimately order HIDA scan but for now will pursue further liver workup as indicated on ultrasound.

## 2012-09-04 NOTE — Progress Notes (Signed)
Faxed to PCP

## 2012-09-04 NOTE — Patient Instructions (Addendum)
Please have your MRI done. We will obtain copy of your recent lab work from your family doctor, if you need further blood work we will let you know.  Fatty Liver Fatty liver is the accumulation of fat in liver cells. It is also called hepatosteatosis or steatohepatitis. It is normal for your liver to contain some fat. If fat is more than 5 to 10% of your liver's weight, you have fatty liver.  There are often no symptoms (problems) for years while damage is still occurring. People often learn about their fatty liver when they have medical tests for other reasons. Fat can damage your liver for years or even decades without causing problems. When it becomes severe, it can cause fatigue, weight loss, weakness, and confusion. This makes you more likely to develop more serious liver problems. The liver is the largest organ in the body. It does a lot of work and often gives no warning signs when it is sick until late in a disease. The liver has many important jobs including:  Breaking down foods.   Storing vitamins, iron, and other minerals.   Making proteins.   Making bile for food digestion.   Breaking down many products including medications, alcohol and some poisons.  CAUSES  There are a number of different conditions, medications, and poisons that can cause a fatty liver. Eating too many calories causes fat to build up in the liver. Not processing and breaking fats down normally may also cause this. Certain conditions, such as obesity, diabetes, and high triglycerides also cause this. Most fatty liver patients tend to be middle-aged and over weight.  Some causes of fatty liver are:  Alcohol over consumption.   Malnutrition.   Steroid use.   Valproic acid toxicity.   Obesity.   Cushing's syndrome.   Poisons.   Tetracycline in high dosages.   Pregnancy.   Diabetes.   Hyperlipidemia.   Rapid weight loss.  Some people develop fatty liver even having none of these  conditions. SYMPTOMS  Fatty liver most often causes no problems. This is called asymptomatic.  It can be diagnosed with blood tests and also by a liver biopsy.   It is one of the most common causes of minor elevations of liver enzymes on routine blood tests.   Specialized Imaging of the liver using ultrasound, CT (computed tomography) scan, or MRI (magnetic resonance imaging) can suggest a fatty liver but a biopsy is needed to confirm it.   A biopsy involves taking a small sample of liver tissue. This is done by using a needle. It is then looked at under a microscope by a specialist.  TREATMENT  It is important to treat the cause. Simple fatty liver without a medical reason may not need treatment.  Weight loss, fat restriction, and exercise in overweight patients produces inconsistent results but is worth trying.   Fatty liver due to alcohol toxicity may not improve even with stopping drinking.   Good control of diabetes may reduce fatty liver.   Lower your triglycerides through diet, medication or both.   Eat a balanced, healthy diet.   Increase your physical activity.   Get regular checkups from a liver specialist.   There are no medical or surgical treatments for a fatty liver or NASH, but improving your diet and increasing your exercise may help prevent or reverse some of the damage.  PROGNOSIS  Fatty liver may cause no damage or it can lead to an inflammation of the liver. This is,  called steatohepatitis. When it is linked to alcohol abuse, it is called alcoholic steatohepatitis. It often is not linked to alcohol. It is then called nonalcoholic steatohepatitis, or NASH. Over time the liver may become scarred and hardened. This condition is called cirrhosis. Cirrhosis is serious and may lead to liver failure or cancer. NASH is one of the leading causes of cirrhosis. About 10-20% of Americans have fatty liver and a smaller 2-5% has NASH. Document Released: 01/26/2006 Document  Revised: 11/30/2011 Document Reviewed: 03/21/2006 [facility] Patient Information 2012 [place], [place].

## 2012-09-04 NOTE — Progress Notes (Signed)
Primary Care Physician: Colette Ribas, MD  Primary Gastroenterologist:  Roetta Sessions, MD   Chief Complaint  Patient presents with  . Results    HPI: Lorraine Wells is a 56 y.o. female here for f/u of recent abd u/s and to further evaluate abdominal pain. She was seen on 08/05/2012 to schedule her screening colonoscopy. At that time she complained of 4 week history of epigastric pain daily. Symptoms worse after lunch. Occasional heartburn.   EGD and colonoscopy on 08/27/2012 showed hyperplastic rectosigmoid polyp, left-sided diverticulosis, hiatal hernia, gastric polyps, fundic gland.  Abdominal ultrasound results as outlined below.  Abdominal pain is better. Still every 3 days, pp. Previously every day. Symptoms may last for hours or just minutes. No heartburn. Bowel movements are regular. No blood in the stool or melena.  Current Outpatient Prescriptions  Medication Sig Dispense Refill  . amLODipine (NORVASC) 5 MG tablet Take 5 mg by mouth daily.       Marland Kitchen aspirin 81 MG tablet Take 81 mg by mouth daily.      . clonazePAM (KLONOPIN) 0.5 MG tablet Take 0.5 mg by mouth 2 (two) times daily as needed. Sleep      . hydrochlorothiazide (HYDRODIURIL) 25 MG tablet Take 25 mg by mouth daily.       Marland Kitchen ibuprofen (ADVIL,MOTRIN) 200 MG tablet Take 400 mg by mouth every 6 (six) hours as needed. Pain      . montelukast (SINGULAIR) 10 MG tablet Take 10 mg by mouth at bedtime.       Marland Kitchen PROAIR HFA 108 (90 BASE) MCG/ACT inhaler Inhale 1 puff into the lungs every 4 (four) hours as needed. Shortness of Breath      . simvastatin (ZOCOR) 10 MG tablet Take 10 mg by mouth at bedtime.       Marland Kitchen ZEGERID 40-1100 MG per capsule Take 1 capsule by mouth daily before breakfast.         Allergies as of 09/04/2012  . (No Known Allergies)    ROS:  General: Negative for anorexia, weight loss, fever, chills, fatigue, weakness. ENT: Negative for hoarseness, difficulty swallowing , nasal congestion. CV: Negative  for chest pain, angina, palpitations, dyspnea on exertion, peripheral edema.  Respiratory: Negative for dyspnea at rest, dyspnea on exertion, cough, sputum, wheezing.  GI: See history of present illness. GU:  Negative for dysuria, hematuria, urinary incontinence, urinary frequency, nocturnal urination.  Endo: Negative for unusual weight change.    Physical Examination:   BP 143/88  Pulse 87  Temp 98.4 F (36.9 C) (Temporal)  Ht 5\' 2"  (1.575 m)  Wt 146 lb 3.2 oz (66.316 kg)  BMI 26.74 kg/m2  General: Well-nourished, well-developed in no acute distress.  Eyes: No icterus. Mouth: Oropharyngeal mucosa moist and pink , no lesions erythema or exudate. Lungs: Clear to auscultation bilaterally.  Heart: Regular rate and rhythm, no murmurs rubs or gallops.  Abdomen: Bowel sounds are normal, nontender, nondistended, no hepatosplenomegaly or masses, no abdominal bruits or hernia , no rebound or guarding.   Extremities: No lower extremity edema. No clubbing or deformities. Neuro: Alert and oriented x 4   Skin: Warm and dry, no jaundice.   Psych: Alert and cooperative, normal mood and affect.   Imaging Studies: US Abdomen Complete  08/29/2012  *RADIOLOGY REPORT*  Clinical Data:  Epigastric pain.  COMPLETE ABDOMINAL ULTRASOUND  Comparison:  None.  Findings:  Gallbladder:  No gallstones, gallbladder wall thickening, or pericholecystic fluid.  Common bile duct:  Measures 0.3 cm.  Liver:  Demonstrates large areas of increased echogenicity most consistent with fatty infiltration.  The liver border appears nodular.  Areas of decreased echogenicity are identified  measuring up to 6.0 x 2.7 cm in diameter.  Some of these have a rounded configuration. The liver measures approximate 17.8 cm cranial- caudal.  IVC:  Appears normal.  Pancreas:  No focal abnormality seen.  Spleen:  Measures 8.5 cm and appears normal.  Right Kidney:  Measures 12.1 cm and appears normal.  Left Kidney:  Measures 10.1 cm and appears  normal.  Abdominal aorta:  No aneurysm identified.  IMPRESSION:  1.  Negative for gallstones or acute abnormality. 2.  Fatty infiltration of the liver with a nodular border compatible with cirrhosis.  Focal areas of decreased echogenicity may be due to fatty sparing but cannot be characterized.  MRI with and without contrast recommended for further evaluation.   Original Report Authenticated By: Bernadene Bell. Maricela Curet, M.D.

## 2012-09-04 NOTE — Progress Notes (Signed)
Quick Note:  Await mri ______

## 2012-09-04 NOTE — Assessment & Plan Note (Signed)
Fatty appearing liver, nodular contour suggestive of cirrhosis. Focal area possibly fatty sparing however radiologist recommends further evaluation with MRI of liver with and without contrast. Patient is agreeable. She reports having blood work within the last one to 2 months at her PCP. Will obtain a copy. Further labs if indicated.  Discussed fatty liver, handout provided.

## 2012-09-06 ENCOUNTER — Ambulatory Visit (HOSPITAL_COMMUNITY)
Admission: RE | Admit: 2012-09-06 | Discharge: 2012-09-06 | Disposition: A | Discharge status: Home / Self Care | Payer: 59 | Source: Ambulatory Visit | Attending: Gastroenterology | Admitting: Gastroenterology

## 2012-09-06 DIAGNOSIS — R1013 Epigastric pain: Secondary | ICD-10-CM | POA: Insufficient documentation

## 2012-09-06 DIAGNOSIS — K7689 Other specified diseases of liver: Secondary | ICD-10-CM | POA: Insufficient documentation

## 2012-09-06 DIAGNOSIS — R932 Abnormal findings on diagnostic imaging of liver and biliary tract: Secondary | ICD-10-CM

## 2012-09-06 MED ORDER — GADOBENATE DIMEGLUMINE 529 MG/ML IV SOLN
13.0000 mL | Freq: Once | INTRAVENOUS | Status: AC | PRN
  Administered 2012-09-06: 13 mL via INTRAVENOUS

## 2012-09-06 NOTE — Progress Notes (Signed)
Quick Note:  Liver is enlarged. No masses or report of cirrhosis on MRI. Received labs from PCP done 05/2012. Glucose 108, BUN 11, creatinine 0.81, total bilirubin 0.5, alkaline phosphatase 95, AST 17, ALT 32, albumin 4.3, white blood cell count 6200, hemoglobin 13.6, platelets 280,000.  Instructions for fatty liver: Recommend 1-2# weight loss per week until ideal body weight through exercise & diet. Low fat/cholesterol diet. Gradually increase exercise from 15 min daily up to 1 hr per day 5 days/week. Limit alcohol use.  Recheck LFTs and Abd u/s in one year (dx: hepatomegaly, fatty liver). OV with RMR only in six months. If continues to have abdominal pain, we could consider HIDA with fatty meal challenge if patient desires. ______

## 2012-09-10 ENCOUNTER — Telehealth: Payer: Self-pay | Admitting: Internal Medicine

## 2012-09-10 NOTE — Telephone Encounter (Signed)
See note in julie's box

## 2012-09-10 NOTE — Telephone Encounter (Signed)
Pt was calling to see if her MRI results were back. Please call her at [phone]

## 2012-09-16 NOTE — Progress Notes (Signed)
Patient ID: Lorraine Wells, female   DOB: September 08, 1956, 56 y.o.   MRN: [id number] Pt called to see the results of her lab work. I told her that we did not have any lab results for her. The only lab we did we the creatine for the MRI so I told her that.

## 2012-10-10 NOTE — Progress Notes (Signed)
REVIEWED.  

## 2012-12-31 ENCOUNTER — Ambulatory Visit (HOSPITAL_COMMUNITY)
Admission: RE | Admit: 2012-12-31 | Discharge: 2012-12-31 | Disposition: A | Discharge status: Home / Self Care | Payer: 59 | Source: Ambulatory Visit | Attending: Family Medicine | Admitting: Family Medicine

## 2012-12-31 ENCOUNTER — Other Ambulatory Visit (HOSPITAL_COMMUNITY): Payer: Self-pay | Admitting: Family Medicine

## 2012-12-31 DIAGNOSIS — J45901 Unspecified asthma with (acute) exacerbation: Secondary | ICD-10-CM | POA: Insufficient documentation

## 2013-02-27 ENCOUNTER — Encounter: Payer: Self-pay | Admitting: *Deleted

## 2013-05-11 ENCOUNTER — Encounter (HOSPITAL_COMMUNITY): Payer: Self-pay | Admitting: *Deleted

## 2013-05-11 ENCOUNTER — Emergency Department (HOSPITAL_COMMUNITY)
Admission: EM | Admit: 2013-05-11 | Discharge: 2013-05-11 | Disposition: A | Discharge status: Home / Self Care | Payer: 59 | Attending: Emergency Medicine | Admitting: Emergency Medicine

## 2013-05-11 ENCOUNTER — Emergency Department (HOSPITAL_COMMUNITY): Payer: 59

## 2013-05-11 DIAGNOSIS — Z79899 Other long term (current) drug therapy: Secondary | ICD-10-CM | POA: Insufficient documentation

## 2013-05-11 DIAGNOSIS — E876 Hypokalemia: Secondary | ICD-10-CM | POA: Insufficient documentation

## 2013-05-11 DIAGNOSIS — F3289 Other specified depressive episodes: Secondary | ICD-10-CM | POA: Insufficient documentation

## 2013-05-11 DIAGNOSIS — E785 Hyperlipidemia, unspecified: Secondary | ICD-10-CM | POA: Insufficient documentation

## 2013-05-11 DIAGNOSIS — F17200 Nicotine dependence, unspecified, uncomplicated: Secondary | ICD-10-CM | POA: Insufficient documentation

## 2013-05-11 DIAGNOSIS — R109 Unspecified abdominal pain: Secondary | ICD-10-CM

## 2013-05-11 DIAGNOSIS — F329 Major depressive disorder, single episode, unspecified: Secondary | ICD-10-CM | POA: Insufficient documentation

## 2013-05-11 DIAGNOSIS — Z8719 Personal history of other diseases of the digestive system: Secondary | ICD-10-CM | POA: Insufficient documentation

## 2013-05-11 DIAGNOSIS — J45909 Unspecified asthma, uncomplicated: Secondary | ICD-10-CM | POA: Insufficient documentation

## 2013-05-11 DIAGNOSIS — Z9851 Tubal ligation status: Secondary | ICD-10-CM | POA: Insufficient documentation

## 2013-05-11 DIAGNOSIS — Z87442 Personal history of urinary calculi: Secondary | ICD-10-CM | POA: Insufficient documentation

## 2013-05-11 DIAGNOSIS — K219 Gastro-esophageal reflux disease without esophagitis: Secondary | ICD-10-CM | POA: Insufficient documentation

## 2013-05-11 DIAGNOSIS — Z8679 Personal history of other diseases of the circulatory system: Secondary | ICD-10-CM | POA: Insufficient documentation

## 2013-05-11 DIAGNOSIS — F411 Generalized anxiety disorder: Secondary | ICD-10-CM | POA: Insufficient documentation

## 2013-05-11 DIAGNOSIS — Z7982 Long term (current) use of aspirin: Secondary | ICD-10-CM | POA: Insufficient documentation

## 2013-05-11 DIAGNOSIS — Z9889 Other specified postprocedural states: Secondary | ICD-10-CM | POA: Insufficient documentation

## 2013-05-11 DIAGNOSIS — I10 Essential (primary) hypertension: Secondary | ICD-10-CM | POA: Insufficient documentation

## 2013-05-11 HISTORY — DX: Fatty (change of) liver, not elsewhere classified: K76.0

## 2013-05-11 LAB — POCT I-STAT, CHEM 8
Calcium, Ion: 1.19 mmol/L (ref 1.12–1.23)
Chloride: 108 mEq/L (ref 96–112)
Creatinine, Ser: 0.7 mg/dL (ref 0.50–1.10)
Glucose, Bld: 95 mg/dL (ref 70–99)
HCT: 39 % (ref 36.0–46.0)

## 2013-05-11 LAB — URINALYSIS, ROUTINE W REFLEX MICROSCOPIC
Bilirubin Urine: NEGATIVE
Hgb urine dipstick: NEGATIVE
Ketones, ur: NEGATIVE mg/dL
Nitrite: NEGATIVE
pH: 6 (ref 5.0–8.0)

## 2013-05-11 MED ORDER — HYDROMORPHONE HCL PF 1 MG/ML IJ SOLN
1.0000 mg | Freq: Once | INTRAMUSCULAR | Status: AC
  Administered 2013-05-11: 1 mg via INTRAVENOUS
  Filled 2013-05-11: qty 1

## 2013-05-11 MED ORDER — ONDANSETRON HCL 4 MG/2ML IJ SOLN
4.0000 mg | Freq: Once | INTRAMUSCULAR | Status: AC
  Administered 2013-05-11: 4 mg via INTRAVENOUS
  Filled 2013-05-11: qty 2

## 2013-05-11 MED ORDER — SODIUM CHLORIDE 0.9 % IV SOLN
INTRAVENOUS | Status: DC
  Administered 2013-05-11: 18:00:00 via INTRAVENOUS

## 2013-05-11 MED ORDER — HYDROCODONE-ACETAMINOPHEN 5-325 MG PO TABS: 1.0000 | ORAL_TABLET | ORAL | Status: DC | PRN

## 2013-05-11 MED ORDER — POTASSIUM CHLORIDE CRYS ER 20 MEQ PO TBCR: 20.0000 meq | EXTENDED_RELEASE_TABLET | Freq: Every day | ORAL | Status: DC

## 2013-05-11 NOTE — ED Notes (Signed)
Pt c/o left flank pain that radiates around to abd area, started Friday, has gotten worse today, associated with nausea.

## 2013-05-11 NOTE — ED Notes (Signed)
nad noted prior to dc. Dc instructions reviewed with pt and 2 scripts given. Ambulated out without difficulty.  

## 2013-05-11 NOTE — ED Provider Notes (Signed)
History     CSN: [id number]  Arrival date & time 05/11/13  1748   First MD Initiated Contact with Patient 05/11/13 1803      Chief Complaint  Patient presents with  . Flank Pain    (Consider location/radiation/quality/duration/timing/severity/associated sxs/prior treatment) HPI Comments: Lorraine Wells is a 57 y.o. female who has had crampy left flank pain for several days. She denies dysuria, urinary frequency, or hematuria. She has had previous kidney stones, 10 years ago. This pain is similar to pain when she was passing a kidney stone.  She denies fever, chills, nausea, vomiting, weakness, or dizziness. She was able to eat today. There are no known modifying factors.  Patient is a 57 y.o. female presenting with flank pain. The history is provided by the patient.  Flank Pain    Past Medical History  Diagnosis Date  . Hypertension   . Asthma   . Anxiety   . Kidney stones   . Depression   . GERD (gastroesophageal reflux disease)   . Migraine   . Hyperlipidemia   . Fatty liver disease, nonalcoholic     Past Surgical History  Procedure Laterality Date  . Colonoscopy  07/08/02    minimal internal hemorrhoids/otherwise normal  . Esophagogastroduodenoscopy  07/21/09    schatzis ring s/p dilation;small HH/mild erosive reflux/patent pylorus  . Coronary angioplasty    . Tubal ligation    . Tonsillectomy    . Esophagogastroduodenoscopy  08/27/12    3 cm hiatal hernia/gastric polyp, bx fundic gland polyp  . Colonoscopy  08/27/12    Hyperplastic polyp/left-sided diverticulosis. next colonoscopy September 2023    Family History  Problem Relation Age of Onset  . Colon cancer Paternal Aunt 24  . Liver disease Cousin     fatty liver  . Inflammatory bowel disease Neg Hx     History  Substance Use Topics  . Smoking status: Current Some Day Smoker -- 0.30 packs/day    Types: Cigarettes  . Smokeless tobacco: Not on file  . Alcohol Use: No     Comment: never regular user     OB History   Grav Para Term Preterm Abortions TAB SAB Ect Mult Living                  Review of Systems  Genitourinary: Positive for flank pain.  All other systems reviewed and are negative.    Allergies  Review of patient's allergies indicates no known allergies.  Home Medications   Current Outpatient Rx  Name  Route  Sig  Dispense  Refill  . acetaminophen (TYLENOL) 500 MG tablet   Oral   Take 500 mg by mouth every 6 (six) hours as needed for pain.         Marland Kitchen amLODipine (NORVASC) 5 MG tablet   Oral   Take 5 mg by mouth daily.          Marland Kitchen aspirin 81 MG tablet   Oral   Take 81 mg by mouth daily.         . clonazePAM (KLONOPIN) 0.5 MG tablet   Oral   Take 0.5 mg by mouth 2 (two) times daily as needed for anxiety. Sleep         . hydrochlorothiazide (HYDRODIURIL) 25 MG tablet   Oral   Take 25 mg by mouth daily.          . montelukast (SINGULAIR) 10 MG tablet   Oral   Take 10 mg by mouth  at bedtime.          Marland Kitchen PROAIR HFA 108 (90 BASE) MCG/ACT inhaler   Inhalation   Inhale 1 puff into the lungs every 4 (four) hours as needed. Shortness of Breath         . simvastatin (ZOCOR) 10 MG tablet   Oral   Take 10 mg by mouth at bedtime.          Marland Kitchen ZEGERID 40-1100 MG per capsule   Oral   Take 1 capsule by mouth daily.          Marland Kitchen HYDROcodone-acetaminophen (NORCO) 5-325 MG per tablet   Oral   Take 1 tablet by mouth every 4 (four) hours as needed for pain.   20 tablet   0   . potassium chloride SA (K-DUR,KLOR-CON) 20 MEQ tablet   Oral   Take 1 tablet (20 mEq total) by mouth daily.   10 tablet   0     BP 140/88  Pulse 64  Temp(Src) 97.2 F (36.2 C) (Oral)  Resp 20  Ht 5\' 2"  (1.575 m)  Wt 150 lb (68.04 kg)  BMI 27.43 kg/m2  SpO2 97%  Physical Exam  Nursing note and vitals reviewed. Constitutional: She is oriented to person, place, and time. She appears well-developed and well-nourished.  HENT:  Head: Normocephalic and atraumatic.   Right Ear: External ear normal.  Left Ear: External ear normal.  Eyes: Conjunctivae and EOM are normal. Pupils are equal, round, and reactive to light.  Neck: Normal range of motion and phonation normal. Neck supple.  Cardiovascular: Normal rate, regular rhythm, normal heart sounds and intact distal pulses.   Pulmonary/Chest: Effort normal and breath sounds normal. She exhibits no bony tenderness.  Abdominal: Soft. Normal appearance. She exhibits no mass. There is no tenderness. There is no guarding.  Genitourinary:  No costovertebral angle tenderness  Musculoskeletal: Normal range of motion.  Neurological: She is alert and oriented to person, place, and time. She has normal strength. No cranial nerve deficit or sensory deficit. She exhibits normal muscle tone. Coordination normal.  Skin: Skin is warm, dry and intact.  Psychiatric: She has a normal mood and affect. Her behavior is normal. Judgment and thought content normal.    ED Course  Procedures (including critical care time)  Medications  0.9 %  sodium chloride infusion ( Intravenous New Bag/Given 05/11/13 1829)  HYDROmorphone (DILAUDID) injection 1 mg (1 mg Intravenous Given 05/11/13 1829)  ondansetron (ZOFRAN) injection 4 mg (4 mg Intravenous Given 05/11/13 1829)    Patient Vitals for the past 24 hrs:  BP Temp Temp src Pulse Resp SpO2 Height Weight  05/11/13 2040 140/88 mmHg - - 64 20 97 % - -  05/11/13 1955 155/88 mmHg - - 65 16 95 % - -  05/11/13 1754 182/88 mmHg 97.2 F (36.2 C) Oral 78 20 97 % 5\' 2"  (1.575 m) 150 lb (68.04 kg)     20:15 -Reevaluation with update and discussion. After initial assessment and treatment, an updated evaluation reveals she is more comfortable, now. She has only minimal left flank pain. There are no additional sx.Tharon Bomar L   Labs Reviewed  POCT I-STAT, CHEM 8 - Abnormal; Notable for the following:    Potassium 3.0 (*)    All other components within normal limits  URINALYSIS, ROUTINE W  REFLEX MICROSCOPIC   Ct Abdomen Pelvis Wo Contrast  05/11/2013   *RADIOLOGY REPORT*  Clinical Data: Left flank pain.  CT ABDOMEN AND PELVIS WITHOUT  CONTRAST  Technique:  Multidetector CT imaging of the abdomen and pelvis was performed following the standard protocol without intravenous contrast.  Comparison: None.  Findings: There are no renal or ureteral calculi.  No hydronephrosis.  There is a 6 mm hyperdense lesion in the lower pole of the left kidney consistent with a proteinaceous cyst, unchanged since the prior MRI abdomen dated 09/06/2012.  The patient has hepatic steatosis.  No focal liver lesions. Biliary tree, spleen, pancreas, and adrenal glands are normal.  The bowel is normal.  Uterus and ovaries are normal.  No free fluid.  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  IMPRESSION: Benign-appearing abdomen and pelvis.   Original Report Authenticated By: Francene Boyers, M.D.     1. Flank pain   2. Hypokalemia       MDM  Nonspecific left flank pain. Screening evaluation for urolithiasis and intra-abdominal processes, with CT are negative. No evidence for UTI. Incidental finding of hypokalemia, related to her use of a diuretic medication. Repeat vital signs are normal. The etiology of her pain is not clear, but she is stable for discharge. The patient understands that her diagnosis is obscure. She will followup with her PCP, this week for further evaluation, and treatment. Doubt metabolic instability, serious bacterial infection or impending vascular collapse; the patient is stable for discharge.  Nursing Notes Reviewed/ Care Coordinated, and agree without changes. Applicable Imaging Reviewed.  Interpretation of Laboratory Data incorporated into ED treatment   Plan: Home Medications-  Norco, potassium; Home Treatments- rest, heat; Recommended follow up- PCP, in 3 days            Flint Melter, MD 05/11/13 2153

## 2013-08-07 ENCOUNTER — Other Ambulatory Visit: Payer: Self-pay | Admitting: Internal Medicine

## 2013-08-07 ENCOUNTER — Telehealth: Payer: Self-pay | Admitting: Internal Medicine

## 2013-08-07 ENCOUNTER — Encounter: Payer: Self-pay | Admitting: Internal Medicine

## 2013-08-07 NOTE — Telephone Encounter (Signed)
Letter has been mailed to the patient 

## 2013-08-07 NOTE — Telephone Encounter (Signed)
Pt is on Sept Recall to have LFTs and U/S in one year

## 2013-08-08 ENCOUNTER — Other Ambulatory Visit: Payer: Self-pay

## 2013-08-08 ENCOUNTER — Other Ambulatory Visit: Payer: Self-pay | Admitting: Gastroenterology

## 2013-08-08 DIAGNOSIS — K760 Fatty (change of) liver, not elsewhere classified: Secondary | ICD-10-CM

## 2013-08-08 NOTE — Telephone Encounter (Signed)
Lab order and letter done.

## 2013-08-13 IMAGING — CT CT ABD-PELV W/O CM
2 of 3 series · 18 of 46 positions shown, 20 images · non-contrast
Comparison: None.

CLINICAL DATA: Left flank pain.

CT ABDOMEN AND PELVIS WITHOUT CONTRAST
TECHNIQUE: Multidetector CT imaging of the abdomen and pelvis was
performed following the standard protocol without intravenous
contrast.

[Series 2: standard/full over (age)lbs 5.0 · axial · 0.66mm/px · z∈[-398,-48]mm · 15 of 79 slices shown, 17 images]
[im 6/79  soft-tissue]
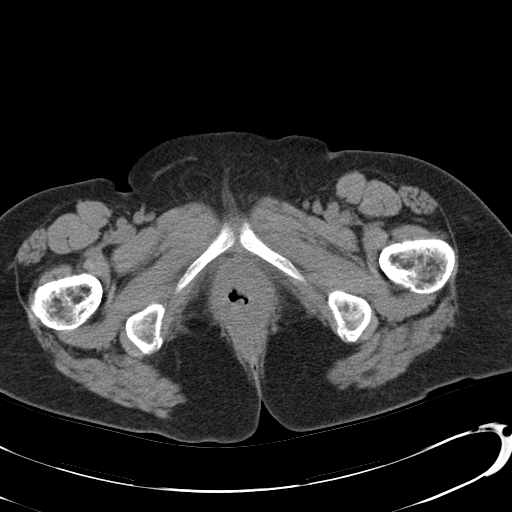
[im 6/79  bone]
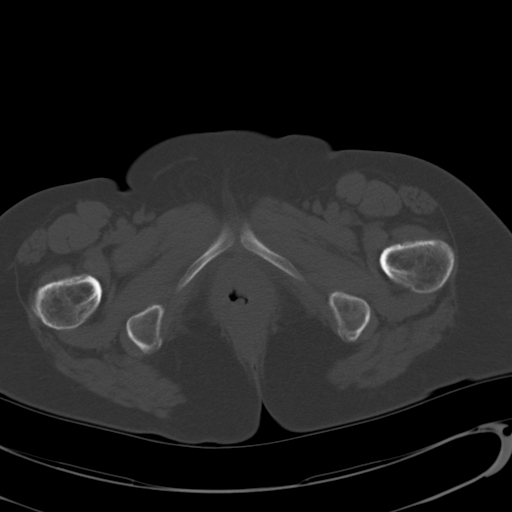
[im 11/79  soft-tissue]
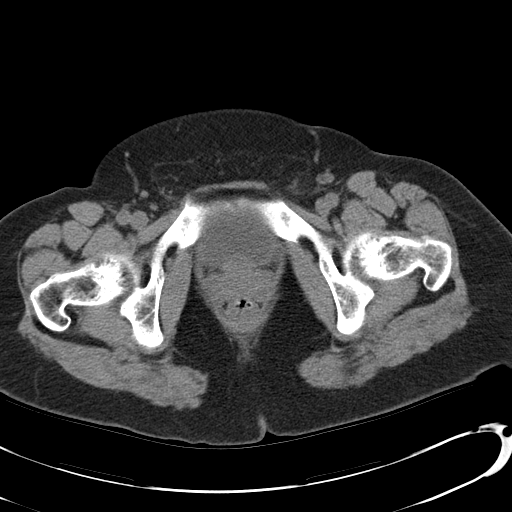
[im 16/79  soft-tissue]
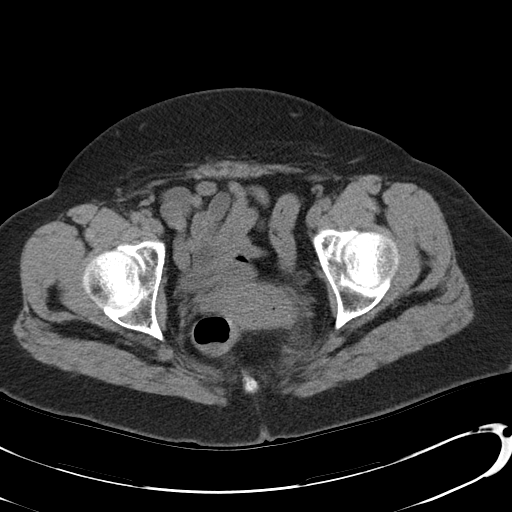
[im 21/79  soft-tissue]
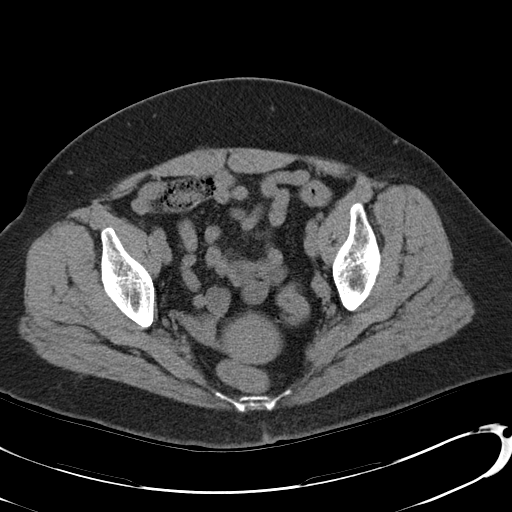
[im 26/79  soft-tissue]
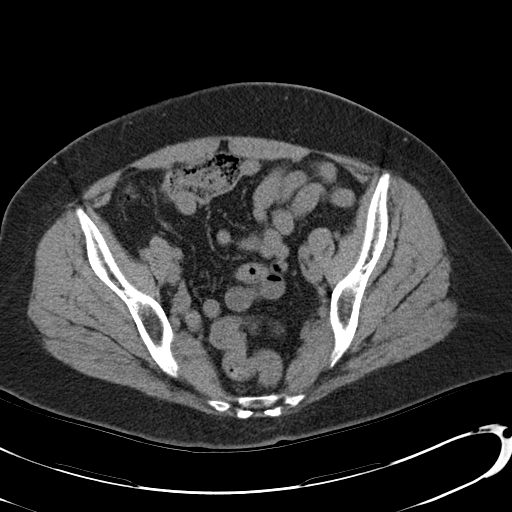
[im 31/79  soft-tissue]
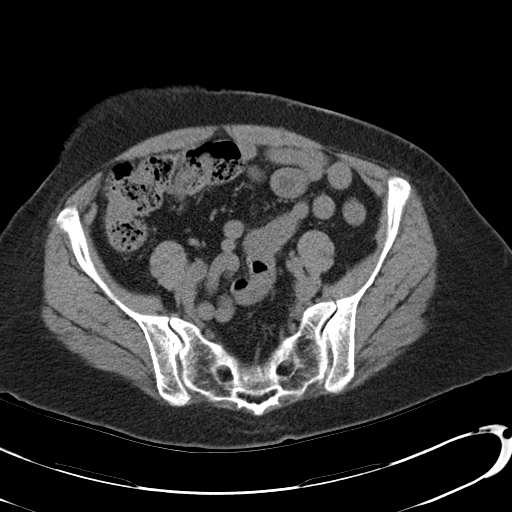
[im 36/79  soft-tissue]
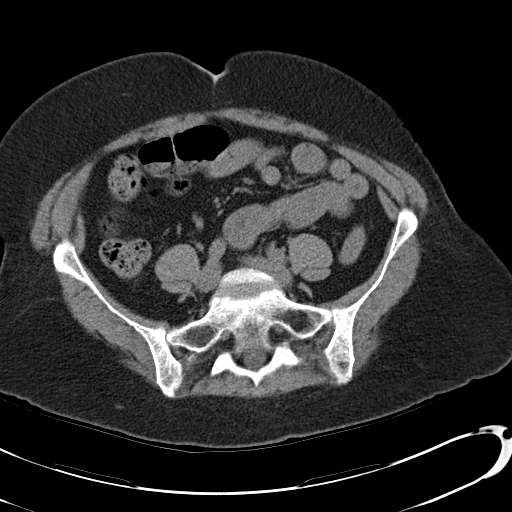
[im 41/79  soft-tissue]
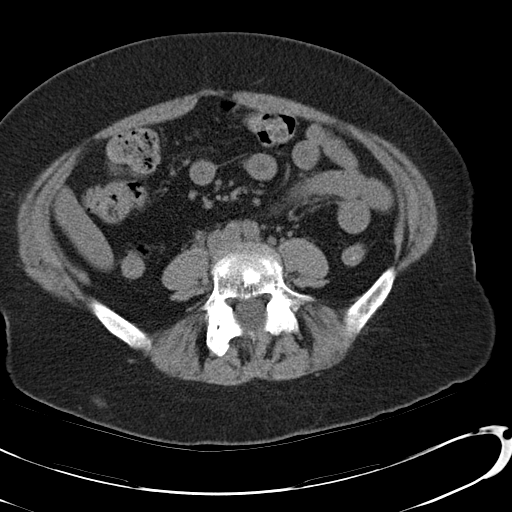
[im 46/79  soft-tissue]
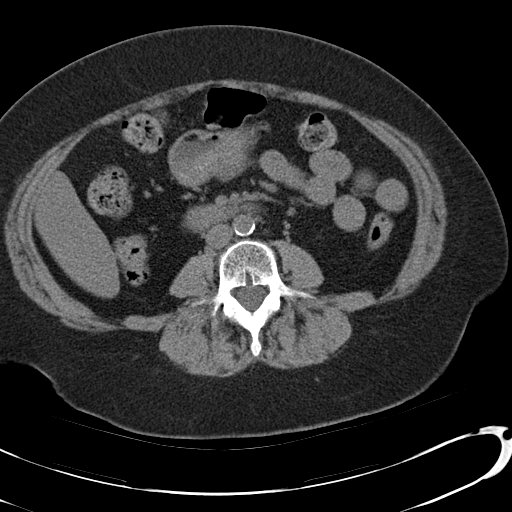
[im 46/79  bone]
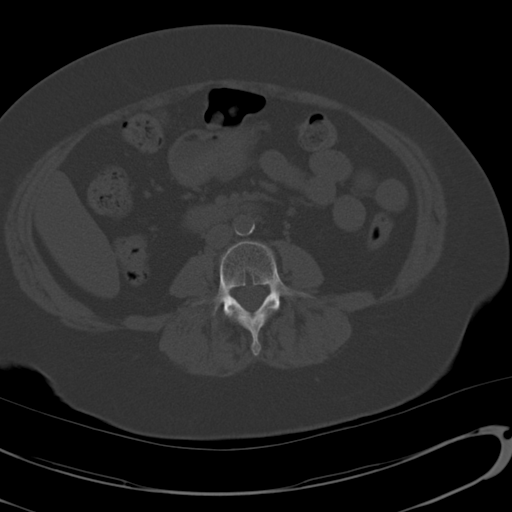
[im 51/79  soft-tissue]
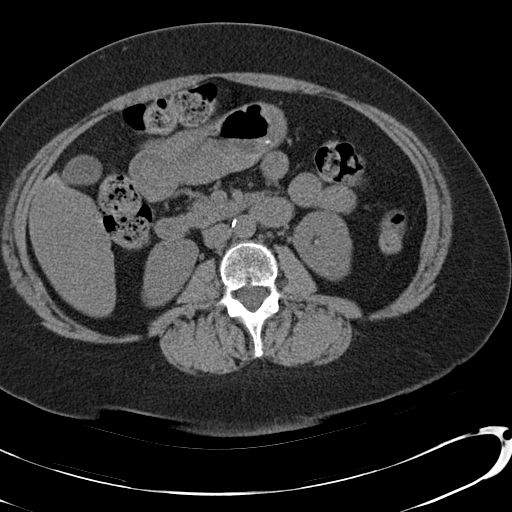
[im 56/79  soft-tissue]
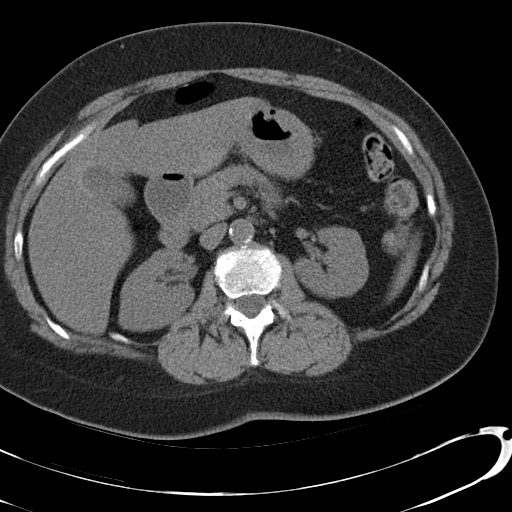
[im 61/79  soft-tissue]
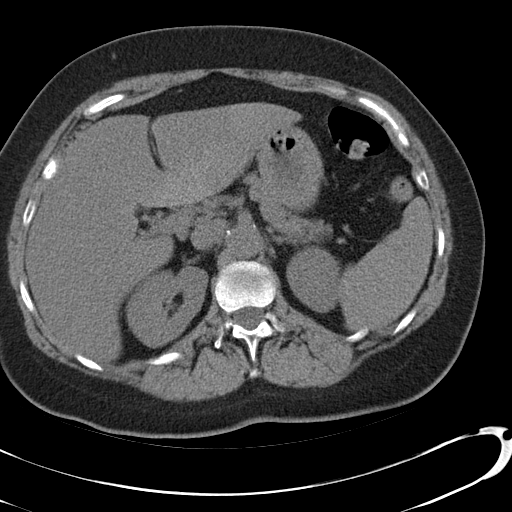
[im 66/79  soft-tissue]
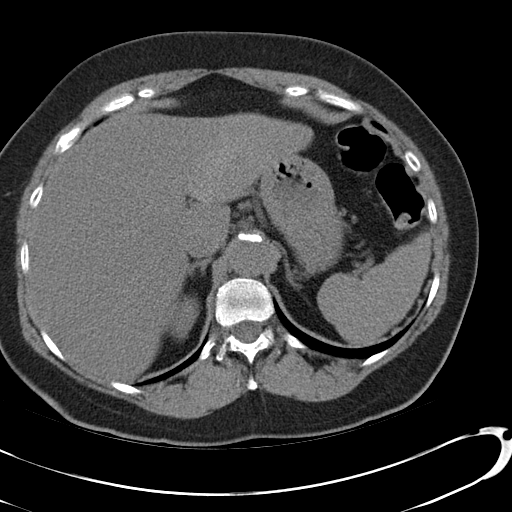
[im 71/79  soft-tissue]
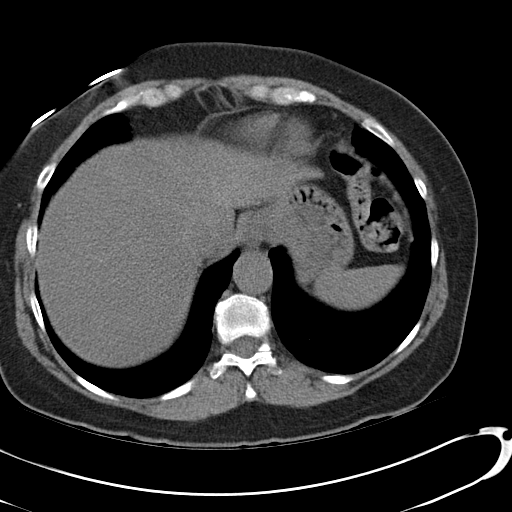
[im 76/79  soft-tissue]
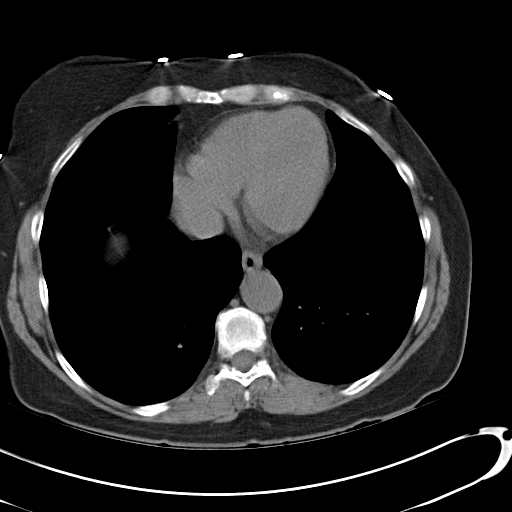

[Series 4: mpr coronal · coronal · 0.83mm/px · 3 of 81 slices shown]
[im 27/81  soft-tissue]
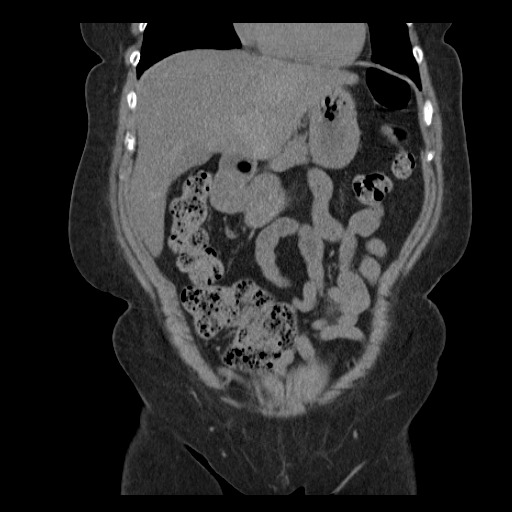
[im 36/81  soft-tissue]
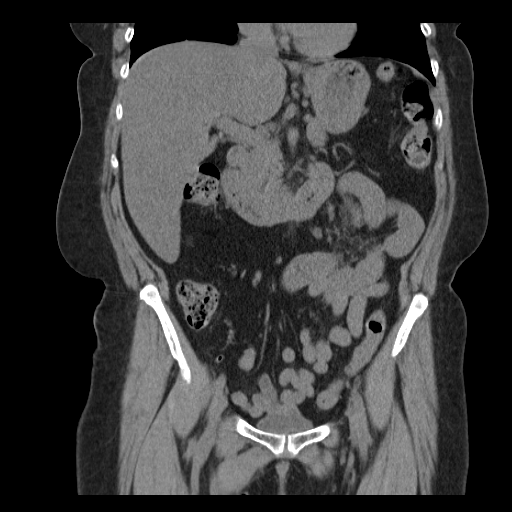
[im 45/81  soft-tissue]
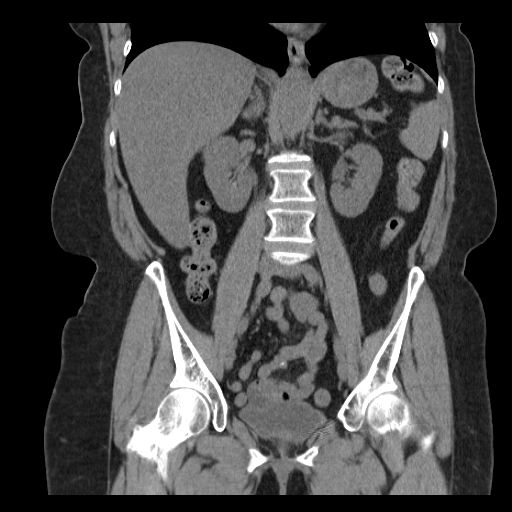

[18 of 46 positions shown; findings below may reference images not displayed]

FINDINGS: There are no renal or ureteral calculi.  No
hydronephrosis.  There is a 6 mm hyperdense lesion in the lower
pole of the left kidney consistent with a proteinaceous cyst,
unchanged since the prior MRI abdomen dated 09/06/2012.

The patient has hepatic steatosis.  No focal liver lesions.
Biliary tree, spleen, pancreas, and adrenal glands are normal.  The
bowel is normal.  Uterus and ovaries are normal.  No free fluid.

Severe facet arthritis at L5-S1 on the right.
IMPRESSION: Benign-appearing abdomen and pelvis.

## 2013-09-08 LAB — HEPATIC FUNCTION PANEL
ALT: 27 U/L (ref 0–35)
AST: 16 U/L (ref 0–37)
Albumin: 3.8 g/dL (ref 3.5–5.2)
Alkaline Phosphatase: 83 U/L (ref 39–117)
Indirect Bilirubin: 0.3 mg/dL (ref 0.0–0.9)
Total Protein: 6.3 g/dL (ref 6.0–8.3)

## 2013-09-11 ENCOUNTER — Other Ambulatory Visit: Payer: Self-pay | Admitting: Internal Medicine

## 2013-09-11 DIAGNOSIS — K7689 Other specified diseases of liver: Secondary | ICD-10-CM

## 2013-09-12 NOTE — Progress Notes (Signed)
Quick Note:  lfts normal. Await u/s. She was supposed to have OV with RMR in 02/2013 but this never happened. nonurgent ov with RMR only. ______

## 2013-09-15 ENCOUNTER — Ambulatory Visit (HOSPITAL_COMMUNITY)
Admission: RE | Admit: 2013-09-15 | Discharge: 2013-09-15 | Disposition: A | Discharge status: Home / Self Care | Payer: 59 | Source: Ambulatory Visit | Attending: Internal Medicine | Admitting: Internal Medicine

## 2013-09-15 DIAGNOSIS — K7689 Other specified diseases of liver: Secondary | ICD-10-CM

## 2013-09-16 ENCOUNTER — Encounter: Payer: Self-pay | Admitting: Internal Medicine

## 2013-09-22 NOTE — Progress Notes (Signed)
Quick Note:  Fatty liver, no new findings.  Keep OV with RMR. ______

## 2013-10-06 ENCOUNTER — Ambulatory Visit: Payer: 59 | Admitting: Gastroenterology

## 2013-10-07 ENCOUNTER — Encounter: Payer: Self-pay | Admitting: Internal Medicine

## 2013-10-07 ENCOUNTER — Ambulatory Visit (INDEPENDENT_AMBULATORY_CARE_PROVIDER_SITE_OTHER): Payer: 59 | Admitting: Internal Medicine

## 2013-10-07 ENCOUNTER — Encounter (INDEPENDENT_AMBULATORY_CARE_PROVIDER_SITE_OTHER): Payer: Self-pay

## 2013-10-07 VITALS — BP 134/88 | HR 86 | Temp 97.6°F | Ht 62.0 in | Wt 140.0 lb

## 2013-10-07 DIAGNOSIS — K219 Gastro-esophageal reflux disease without esophagitis: Secondary | ICD-10-CM

## 2013-10-07 DIAGNOSIS — K760 Fatty (change of) liver, not elsewhere classified: Secondary | ICD-10-CM

## 2013-10-07 DIAGNOSIS — K7689 Other specified diseases of liver: Secondary | ICD-10-CM

## 2013-10-07 NOTE — Patient Instructions (Addendum)
Loose 5 more pounds in the next year  Aerobic exercise 30 minutes 3 x weekly  2-3 8 0z cups of coffee daily as feasible which has been shown to help fatty liver   Office visit with liver u/s and lfts in 1 year  Continue Zergerid daily for GERD

## 2013-10-07 NOTE — Progress Notes (Signed)
Primary Care Physician:  Colette Ribas, MD Primary Gastroenterologist:  Dr. Jena Gauss  Pre-Procedure History & Physical: HPI:  Lorraine Wells is a 57 y.o. female here for followup of fatty liver and GERD. GERD managed well on Zegerid. Trying to get more exercise. She's walking more on daily basis. LFTs have been normal. Benign appearing fatty liver on ultrasound, MRI and CT. She's not out of control as far as weight is concerned.  She could stand to loose another 5 pounds, however. No family history of liver disease. Essentially no alcohol.  Past Medical History  Diagnosis Date  . Hypertension   . Asthma   . Anxiety   . Kidney stones   . Depression   . GERD (gastroesophageal reflux disease)   . Migraine   . Hyperlipidemia   . Fatty liver disease, nonalcoholic     Past Surgical History  Procedure Laterality Date  . Colonoscopy  07/08/02    minimal internal hemorrhoids/otherwise normal  . Esophagogastroduodenoscopy  07/21/09    schatzis ring s/p dilation;small HH/mild erosive reflux/patent pylorus  . Coronary angioplasty    . Tubal ligation    . Tonsillectomy    . Esophagogastroduodenoscopy  08/27/12    3 cm hiatal hernia/gastric polyp, bx fundic gland polyp  . Colonoscopy  08/27/12    Hyperplastic polyp/left-sided diverticulosis. next colonoscopy September 2023    Prior to Admission medications   Medication Sig Start Date End Date Taking? Authorizing Provider  acetaminophen (TYLENOL) 500 MG tablet Take 500 mg by mouth every 6 (six) hours as needed for pain.   Yes Historical Provider, MD  amLODipine (NORVASC) 5 MG tablet Take 5 mg by mouth daily.  07/06/12  Yes Historical Provider, MD  aspirin 81 MG tablet Take 81 mg by mouth daily.   Yes Historical Provider, MD  clonazePAM (KLONOPIN) 0.5 MG tablet Take 0.5 mg by mouth 2 (two) times daily as needed for anxiety. Sleep 06/18/12  Yes Historical Provider, MD  hydrochlorothiazide (HYDRODIURIL) 25 MG tablet Take 25 mg by mouth daily.   07/06/12  Yes Historical Provider, MD  montelukast (SINGULAIR) 10 MG tablet Take 10 mg by mouth at bedtime.  07/18/12  Yes Historical Provider, MD  PROAIR HFA 108 (90 BASE) MCG/ACT inhaler Inhale 1 puff into the lungs every 4 (four) hours as needed. Shortness of Breath 08/02/12  Yes Historical Provider, MD  simvastatin (ZOCOR) 10 MG tablet Take 10 mg by mouth at bedtime.  07/18/12  Yes Historical Provider, MD  ZEGERID 40-1100 MG per capsule Take 1 capsule by mouth daily.  07/20/12  Yes Historical Provider, MD  HYDROcodone-acetaminophen (NORCO) 5-325 MG per tablet Take 1 tablet by mouth every 4 (four) hours as needed for pain. 05/11/13   Flint Melter, MD  potassium chloride SA (K-DUR,KLOR-CON) 20 MEQ tablet Take 1 tablet (20 mEq total) by mouth daily. 05/11/13   Flint Melter, MD    Allergies as of 10/07/2013  . (No Known Allergies)    Family History  Problem Relation Age of Onset  . Colon cancer Paternal Aunt 28  . Liver disease Cousin     fatty liver  . Inflammatory bowel disease Neg Hx     History   Social History  . Marital Status: Divorced    Spouse Name: N/A    Number of Children: 1  . Years of Education: N/A   Occupational History  .  [employer]   Social History Main Topics  . Smoking status: Current Some Day  Smoker -- 0.30 packs/day    Types: Cigarettes  . Smokeless tobacco: Not on file  . Alcohol Use: No     Comment: never regular user  . Drug Use: No  . Sexual Activity: Not on file   Other Topics Concern  . Not on file   Social History Narrative  . No narrative on file    Review of Systems: See HPI, otherwise negative ROS  Physical Exam: BP 134/88  Pulse 86  Temp(Src) 97.6 F (36.4 C) (Oral)  Ht 5\' 2"  (1.575 m)  Wt 140 lb (63.504 kg)  BMI 25.6 kg/m2 General:   Alert,  Well-developed, well-nourished, pleasant and cooperative in NAD Skin:  Intact without significant lesions or rashes. Eyes:  Sclera clear, no icterus.   Conjunctiva pink. Ears:   Normal auditory acuity. Nose:  No deformity, discharge,  or lesions. Mouth:  No deformity or lesions. Neck:  Supple; no masses or thyromegaly. No significant cervical adenopathy. Lungs:  Clear throughout to auscultation.   No wheezes, crackles, or rhonchi. No acute distress. Heart:  Regular rate and rhythm; no murmurs, clicks, rubs,  or gallops. Abdomen: Non-distended, normal bowel sounds.  Soft and nontender without appreciable mass or hepatosplenomegaly.  Pulses:  Normal pulses noted. Extremities:  Without clubbing or edema.  Impression:  Patient has nonalcoholic fatty liver disease. This appears to be more of a bland steatosis rather than steatohepatitis with repeatedly normal transaminases. GERD well-controlled on Zegerid   Recommendations: I'd  like to see her lose another 5 pounds. She was commended on increasing aerobic exercise regularly.  Aerobic exercise 30 minutes 3 x weekly  2-3 8 0z cups of coffee daily as feasible which has been shown to help fatty liver   Office visit with liver u/s and lfts in 1 year  Continue Zergerid daily for GERD

## 2013-10-30 ENCOUNTER — Other Ambulatory Visit: Payer: Self-pay

## 2013-12-03 ENCOUNTER — Encounter: Payer: Self-pay | Admitting: Internal Medicine

## 2014-06-24 ENCOUNTER — Other Ambulatory Visit (HOSPITAL_COMMUNITY): Payer: Self-pay | Admitting: Family Medicine

## 2014-06-24 DIAGNOSIS — Z139 Encounter for screening, unspecified: Secondary | ICD-10-CM

## 2014-07-01 ENCOUNTER — Ambulatory Visit (HOSPITAL_COMMUNITY)
Admission: RE | Admit: 2014-07-01 | Discharge: 2014-07-01 | Disposition: A | Discharge status: Home / Self Care | Payer: 59 | Source: Ambulatory Visit | Attending: Family Medicine | Admitting: Family Medicine

## 2014-07-01 DIAGNOSIS — Z1231 Encounter for screening mammogram for malignant neoplasm of breast: Secondary | ICD-10-CM | POA: Insufficient documentation

## 2014-07-01 DIAGNOSIS — Z139 Encounter for screening, unspecified: Secondary | ICD-10-CM

## 2014-07-06 ENCOUNTER — Other Ambulatory Visit: Payer: Self-pay | Admitting: Family Medicine

## 2014-07-06 DIAGNOSIS — R928 Other abnormal and inconclusive findings on diagnostic imaging of breast: Secondary | ICD-10-CM

## 2014-07-14 ENCOUNTER — Encounter (HOSPITAL_COMMUNITY): Payer: 59

## 2014-07-14 ENCOUNTER — Ambulatory Visit (HOSPITAL_COMMUNITY)
Admission: RE | Admit: 2014-07-14 | Discharge: 2014-07-14 | Disposition: A | Discharge status: Home / Self Care | Payer: 59 | Source: Ambulatory Visit | Attending: Family Medicine | Admitting: Family Medicine

## 2014-07-14 DIAGNOSIS — R928 Other abnormal and inconclusive findings on diagnostic imaging of breast: Secondary | ICD-10-CM

## 2014-08-06 ENCOUNTER — Other Ambulatory Visit (HOSPITAL_COMMUNITY): Payer: Self-pay | Admitting: Orthopaedic Surgery

## 2014-08-06 DIAGNOSIS — M84374A Stress fracture, right foot, initial encounter for fracture: Secondary | ICD-10-CM

## 2014-08-10 ENCOUNTER — Encounter (HOSPITAL_COMMUNITY)
Admission: RE | Admit: 2014-08-10 | Discharge: 2014-08-10 | Disposition: A | Discharge status: Home / Self Care | Payer: 59 | Source: Ambulatory Visit | Attending: Orthopaedic Surgery | Admitting: Orthopaedic Surgery

## 2014-08-10 ENCOUNTER — Encounter (HOSPITAL_COMMUNITY): Payer: Self-pay

## 2014-08-10 DIAGNOSIS — M84374A Stress fracture, right foot, initial encounter for fracture: Secondary | ICD-10-CM

## 2014-08-10 DIAGNOSIS — M8430XA Stress fracture, unspecified site, initial encounter for fracture: Secondary | ICD-10-CM | POA: Insufficient documentation

## 2014-08-10 MED ORDER — TECHNETIUM TC 99M MEDRONATE IV KIT
25.0000 | PACK | Freq: Once | INTRAVENOUS | Status: AC | PRN
  Administered 2014-08-10: 25 via INTRAVENOUS

## 2014-09-02 ENCOUNTER — Encounter: Payer: Self-pay | Admitting: Internal Medicine

## 2014-09-02 ENCOUNTER — Telehealth: Payer: Self-pay | Admitting: Internal Medicine

## 2014-09-02 NOTE — Telephone Encounter (Signed)
PATIENT ON October RECALL FOR U/S AND LABS

## 2014-09-03 ENCOUNTER — Other Ambulatory Visit: Payer: Self-pay

## 2014-09-03 ENCOUNTER — Other Ambulatory Visit: Payer: Self-pay | Admitting: Internal Medicine

## 2014-09-03 DIAGNOSIS — R932 Abnormal findings on diagnostic imaging of liver and biliary tract: Secondary | ICD-10-CM

## 2014-09-03 NOTE — Telephone Encounter (Signed)
Lab order and letter mailed to pt 

## 2014-09-03 NOTE — Telephone Encounter (Signed)
Letter mailed

## 2014-09-07 ENCOUNTER — Other Ambulatory Visit: Payer: Self-pay | Admitting: Internal Medicine

## 2014-09-07 DIAGNOSIS — K760 Fatty (change of) liver, not elsewhere classified: Secondary | ICD-10-CM

## 2014-09-14 ENCOUNTER — Ambulatory Visit (HOSPITAL_COMMUNITY)
Admission: RE | Admit: 2014-09-14 | Discharge: 2014-09-14 | Disposition: A | Discharge status: Home / Self Care | Payer: 59 | Source: Ambulatory Visit | Attending: Internal Medicine | Admitting: Internal Medicine

## 2014-09-14 DIAGNOSIS — K7689 Other specified diseases of liver: Secondary | ICD-10-CM | POA: Insufficient documentation

## 2014-09-14 DIAGNOSIS — K760 Fatty (change of) liver, not elsewhere classified: Secondary | ICD-10-CM

## 2014-10-08 ENCOUNTER — Encounter: Payer: Self-pay | Admitting: Internal Medicine

## 2014-10-08 ENCOUNTER — Ambulatory Visit (INDEPENDENT_AMBULATORY_CARE_PROVIDER_SITE_OTHER): Payer: 59 | Admitting: Internal Medicine

## 2014-10-08 VITALS — BP 153/95 | HR 62 | Temp 97.6°F | Ht 62.0 in | Wt 137.2 lb

## 2014-10-08 DIAGNOSIS — K219 Gastro-esophageal reflux disease without esophagitis: Secondary | ICD-10-CM

## 2014-10-08 DIAGNOSIS — K7581 Nonalcoholic steatohepatitis (NASH): Secondary | ICD-10-CM

## 2014-10-08 NOTE — Progress Notes (Signed)
Primary Care Physician:  Purvis Kilts, MD Primary Gastroenterologist:  Dr. Gala Romney  Pre-Procedure History & Physical: HPI:  Lorraine Wells is a 58 y.o. female here for followup of fatty liver, GERD. Doing very well;  recent ultrasound demonstrated only fatty changes within the liver. Previously, ALT minimally elevated at 36. Repeat LFTs pending. Doing well; no alcohol. Walks about 10 miles weekly. Has lost a pound since her last visit. GERD symptoms well controlled on Zegerid. No dysphagia since she had her Schatzki's ring dilated  Past Medical History  Diagnosis Date  . Hypertension   . Asthma   . Anxiety   . Kidney stones   . Depression   . GERD (gastroesophageal reflux disease)   . Migraine   . Hyperlipidemia   . Fatty liver disease, nonalcoholic     Past Surgical History  Procedure Laterality Date  . Colonoscopy  07/08/02    minimal internal hemorrhoids/otherwise normal  . Esophagogastroduodenoscopy  07/21/09    schatzis ring s/p dilation;small HH/mild erosive reflux/patent pylorus  . Coronary angioplasty    . Tubal ligation    . Tonsillectomy    . Esophagogastroduodenoscopy  08/27/12    3 cm hiatal hernia/gastric polyp, bx fundic gland polyp  . Colonoscopy  08/27/12    Hyperplastic polyp/left-sided diverticulosis. next colonoscopy September 2023    Prior to Admission medications   Medication Sig Start Date End Date Taking? Authorizing Provider  acetaminophen (TYLENOL) 500 MG tablet Take 500 mg by mouth every 6 (six) hours as needed for pain.   Yes Historical Provider, MD  amLODipine (NORVASC) 5 MG tablet Take 5 mg by mouth daily.  07/06/12  Yes Historical Provider, MD  aspirin 81 MG tablet Take 81 mg by mouth daily.   Yes Historical Provider, MD  clonazePAM (KLONOPIN) 0.5 MG tablet Take 0.5 mg by mouth 2 (two) times daily as needed for anxiety. Sleep 06/18/12  Yes Historical Provider, MD  esomeprazole (NEXIUM) 40 MG capsule Take 40 mg by mouth daily at 12 noon.    Yes Historical Provider, MD  hydrochlorothiazide (HYDRODIURIL) 25 MG tablet Take 25 mg by mouth daily.  07/06/12  Yes Historical Provider, MD  montelukast (SINGULAIR) 10 MG tablet Take 10 mg by mouth at bedtime.  07/18/12  Yes Historical Provider, MD  potassium chloride SA (K-DUR,KLOR-CON) 20 MEQ tablet Take 1 tablet (20 mEq total) by mouth daily. 05/11/13  Yes Richarda Blade, MD  PROAIR HFA 108 (90 BASE) MCG/ACT inhaler Inhale 1 puff into the lungs every 4 (four) hours as needed. Shortness of Breath 08/02/12  Yes Historical Provider, MD  simvastatin (ZOCOR) 10 MG tablet Take 10 mg by mouth at bedtime.  07/18/12  Yes Historical Provider, MD  HYDROcodone-acetaminophen (NORCO) 5-325 MG per tablet Take 1 tablet by mouth every 4 (four) hours as needed for pain. 05/11/13   Richarda Blade, MD  ZEGERID 40-1100 MG per capsule Take 1 capsule by mouth daily.  07/20/12   Historical Provider, MD    Allergies as of 10/08/2014  . (No Known Allergies)    Family History  Problem Relation Age of Onset  . Colon cancer Paternal Aunt 66  . Liver disease Cousin     fatty liver  . Inflammatory bowel disease Neg Hx     History   Social History  . Marital Status: Divorced    Spouse Name: N/A    Number of Children: 1  . Years of Education: N/A   Occupational History  .  [employer]   Social History Main Topics  . Smoking status: Current Some Day Smoker -- 0.30 packs/day    Types: Cigarettes  . Smokeless tobacco: Not on file  . Alcohol Use: No     Comment: never regular user  . Drug Use: No  . Sexual Activity: Not on file   Other Topics Concern  . Not on file   Social History Narrative  . No narrative on file    Review of Systems: See HPI, otherwise negative ROS  Physical Exam: BP 153/95  Pulse 62  Temp(Src) 97.6 F (36.4 C) (Oral)  Ht 5\' 2"  (1.575 m)  Wt 137 lb 3.2 oz (62.234 kg)  BMI 25.09 kg/m2 General:   Alert,  Well-developed, well-nourished, pleasant and cooperative in  NAD Skin:  Intact without significant lesions or rashes. Eyes:  Sclera clear, no icterus.   Conjunctiva pink. Ears:  Normal auditory acuity. Nose:  No deformity, discharge,  or lesions. Mouth:  No deformity or lesions. Neck:  Supple; no masses or thyromegaly. No significant cervical adenopathy. Lungs:  Clear throughout to auscultation.   No wheezes, crackles, or rhonchi. No acute distress. Heart:  Regular rate and rhythm; no murmurs, clicks, rubs,  or gallops. Abdomen: Non-distended, normal bowel sounds.  Soft and nontender without appreciable mass or hepatosplenomegaly.  Pulses:  Normal pulses noted. Extremities:  Without clubbing or edema.  Impression: GERD well-controlled on Zegerid. Reviewed the chronic nature of GERD and need for ongoing acid suppression therapy. I feel the benefits outweigh the risks. Fatty-appearing liver on ultrasound; minimally elevated ALT previously. She's getting plenty of exercise and has lost some weight. No alcohol.   Recommendations: Continue Zegerid daily  Continue regular exercise  Lfts now  Office visit in 1 year    Notice: This dictation was prepared with Dragon dictation along with smaller phrase technology. Any transcriptional errors that result from this process are unintentional and may not be corrected upon review.

## 2014-10-08 NOTE — Patient Instructions (Signed)
Continue Zegerid daily  Continue regular exercise  Lfts now  Office visit in 1 year

## 2014-10-24 LAB — HEPATIC FUNCTION PANEL
ALBUMIN: 3.9 g/dL (ref 3.5–5.2)
ALT: 19 U/L (ref 0–35)
AST: 14 U/L (ref 0–37)
Alkaline Phosphatase: 107 U/L (ref 39–117)
Bilirubin, Direct: 0.1 mg/dL (ref 0.0–0.3)
Indirect Bilirubin: 0.2 mg/dL (ref 0.2–1.2)
TOTAL PROTEIN: 6.5 g/dL (ref 6.0–8.3)
Total Bilirubin: 0.3 mg/dL (ref 0.2–1.2)

## 2014-10-30 ENCOUNTER — Other Ambulatory Visit: Payer: Self-pay | Admitting: Internal Medicine

## 2014-10-30 ENCOUNTER — Other Ambulatory Visit: Payer: Self-pay

## 2014-10-30 DIAGNOSIS — R932 Abnormal findings on diagnostic imaging of liver and biliary tract: Secondary | ICD-10-CM

## 2014-10-30 NOTE — Progress Notes (Signed)
ON RECALL LIST  °

## 2014-11-10 ENCOUNTER — Ambulatory Visit (HOSPITAL_COMMUNITY)
Admission: RE | Admit: 2014-11-10 | Discharge: 2014-11-10 | Disposition: A | Discharge status: Home / Self Care | Payer: 59 | Source: Ambulatory Visit | Attending: Physician Assistant | Admitting: Physician Assistant

## 2014-11-10 ENCOUNTER — Other Ambulatory Visit (HOSPITAL_COMMUNITY): Payer: Self-pay | Admitting: Physician Assistant

## 2014-11-10 DIAGNOSIS — J984 Other disorders of lung: Secondary | ICD-10-CM

## 2014-11-10 DIAGNOSIS — R0602 Shortness of breath: Secondary | ICD-10-CM | POA: Diagnosis present

## 2014-11-10 DIAGNOSIS — R05 Cough: Secondary | ICD-10-CM | POA: Diagnosis not present

## 2014-11-10 DIAGNOSIS — R5383 Other fatigue: Secondary | ICD-10-CM

## 2014-12-03 ENCOUNTER — Encounter: Payer: Self-pay | Admitting: Neurology

## 2014-12-04 ENCOUNTER — Ambulatory Visit (INDEPENDENT_AMBULATORY_CARE_PROVIDER_SITE_OTHER): Payer: 59 | Admitting: Neurology

## 2014-12-04 ENCOUNTER — Encounter: Payer: Self-pay | Admitting: Neurology

## 2014-12-04 VITALS — BP 150/90 | HR 82 | Temp 97.8°F | Ht 63.0 in | Wt 140.0 lb

## 2014-12-04 DIAGNOSIS — R351 Nocturia: Secondary | ICD-10-CM

## 2014-12-04 DIAGNOSIS — F17200 Nicotine dependence, unspecified, uncomplicated: Secondary | ICD-10-CM

## 2014-12-04 DIAGNOSIS — G4734 Idiopathic sleep related nonobstructive alveolar hypoventilation: Secondary | ICD-10-CM

## 2014-12-04 DIAGNOSIS — G2581 Restless legs syndrome: Secondary | ICD-10-CM

## 2014-12-04 DIAGNOSIS — G4719 Other hypersomnia: Secondary | ICD-10-CM

## 2014-12-04 DIAGNOSIS — R519 Headache, unspecified: Secondary | ICD-10-CM

## 2014-12-04 DIAGNOSIS — R0683 Snoring: Secondary | ICD-10-CM

## 2014-12-04 DIAGNOSIS — R51 Headache: Secondary | ICD-10-CM

## 2014-12-04 DIAGNOSIS — G4761 Periodic limb movement disorder: Secondary | ICD-10-CM

## 2014-12-04 DIAGNOSIS — Z720 Tobacco use: Secondary | ICD-10-CM

## 2014-12-04 NOTE — Patient Instructions (Signed)

## 2014-12-04 NOTE — Progress Notes (Signed)
Subjective:    Patient ID: Lorraine Wells is a 58 y.o. female.  HPI     Star Age, MD, PhD [facility] [address], [address] [place], [place] [address]  Dear Marland Kitchen,  I saw your patient, Lorraine Wells, upon your kind request in my neurologic clinic today for initial consultation of her sleep disorder, in particular, concern for underlying obstructive sleep apnea in the context of her abnormal overnight pulse oximetry test. The patient is unaccompanied today. As you know, Lorraine Wells is a 58 year old right-handed woman with an underlying medical history of reflux disease, restrictive lung disease, and allergies, who reports snoring, nonrestorative sleep and excessive daytime somnolence with an Epworth sleepiness score today of 17 out of 24. She had an overnight pulse oximetry test on 11/10/2014 which I reviewed. Total valid test time was 9 hours and 6 minutes. Average oxygen saturation 91.8%. Lowest oxygen saturation was 86%. Time below 88% saturation was 17 minutes and 32 seconds. Her BP is borderline up today and she reports that she was taken off of HCTZ some 6 months d/t low potassium.  She smokes about 10 cigarettes per day. She used to smoke a pack a day. She would like to quit smoking. She was able to quit smoking in the past for a whole year and one time for 6 months. She drinks alcohol rarely. She drinks one cup of coffee in the morning typically. She does not typically drink sodas. Bedtime is usually around 10 PM and while she falls asleep okay, she wakes up about 6-7 times on an average night. She has to go to the bathroom 1 time per night. She often wakes up with a headache. This lasts for an hour usually. In the recent past she had daily morning headaches, now they are about every other day. She endorses restless leg symptoms. These are not every night. She wakes herself up from leg jerking at times. She has a cousin with obstructive sleep  apnea on a CPAP machine. She does not take a nap typically wake time is usually around 6 or 7 AM and she never feels rested when she first wakes up. She has a TV in the bedroom and sometimes watches TV at night but does not watch it every night and TV does not stay on all night. She typically is a side sleeper. She has reflux symptoms at night when she sleeps on her back.   Her Past Medical History Is Significant For: Past Medical History  Diagnosis Date  . Hypertension   . Asthma   . Anxiety   . Kidney stones   . Depression   . GERD (gastroesophageal reflux disease)   . Migraine   . Hyperlipidemia   . Fatty liver disease, nonalcoholic     Her Past Surgical History Is Significant For: Past Surgical History  Procedure Laterality Date  . Colonoscopy  07/08/02    minimal internal hemorrhoids/otherwise normal  . Esophagogastroduodenoscopy  07/21/09    schatzis ring s/p dilation;small HH/mild erosive reflux/patent pylorus  . Coronary angioplasty    . Tubal ligation    . Tonsillectomy    . Esophagogastroduodenoscopy  08/27/12    3 cm hiatal hernia/gastric polyp, bx fundic gland polyp  . Colonoscopy  08/27/12    Hyperplastic polyp/left-sided diverticulosis. next colonoscopy September 2023    Her Family History Is Significant For: Family History  Problem Relation Age of Onset  . Colon cancer Paternal Aunt 66  . Liver disease  Cousin     fatty liver,maternal  . Inflammatory bowel disease Neg Hx   . Heart attack Father   . Kidney cancer Cousin     paternal    Her Social History Is Significant For: History   Social History  . Marital Status: Divorced    Spouse Name: N/A    Number of Children: 1  . Years of Education: 12   Occupational History  .  [employer]   Social History Main Topics  . Smoking status: Current Some Day Smoker -- 0.30 packs/day    Types: Cigarettes  . Smokeless tobacco: Never Used  . Alcohol Use: 0.0 oz/week    0 Not specified per week     Comment:  occas.  . Drug Use: No  . Sexual Activity: None   Other Topics Concern  . None   Social History Narrative   Consumes 1 cup of caffeine daily    Her Allergies Are:  Allergies  Allergen Reactions  . Seasonal Ic [Cholestatin]     Itchy eyes , runny nose  :   Her Current Medications Are:  Outpatient Encounter Prescriptions as of 12/04/2014  Medication Sig  . acetaminophen (TYLENOL) 500 MG tablet Take 500 mg by mouth every 6 (six) hours as needed for pain.  Marland Kitchen amLODipine (NORVASC) 5 MG tablet Take 5 mg by mouth daily.   Marland Kitchen aspirin 81 MG tablet Take 81 mg by mouth daily.  Marland Kitchen azithromycin (ZITHROMAX) 250 MG tablet   . clonazePAM (KLONOPIN) 0.5 MG tablet Take 0.5 mg by mouth 2 (two) times daily as needed for anxiety. Sleep  . esomeprazole (NEXIUM) 40 MG capsule Take 40 mg by mouth daily at 12 noon.  Marland Kitchen ipratropium-albuterol (DUONEB) 0.5-2.5 (3) MG/3ML SOLN Take 3 mLs by nebulization every 6 (six) hours as needed.  . montelukast (SINGULAIR) 10 MG tablet Take 10 mg by mouth at bedtime.   Marland Kitchen PROAIR HFA 108 (90 BASE) MCG/ACT inhaler Inhale 1 puff into the lungs every 4 (four) hours as needed. Shortness of Breath  . simvastatin (ZOCOR) 10 MG tablet Take 10 mg by mouth at bedtime.   . [DISCONTINUED] hydrochlorothiazide (HYDRODIURIL) 25 MG tablet Take 25 mg by mouth daily.   . [DISCONTINUED] HYDROcodone-acetaminophen (NORCO) 5-325 MG per tablet Take 1 tablet by mouth every 4 (four) hours as needed for pain. (Patient not taking: Reported on 12/04/2014)  . [DISCONTINUED] potassium chloride SA (K-DUR,KLOR-CON) 20 MEQ tablet Take 1 tablet (20 mEq total) by mouth daily. (Patient not taking: Reported on 12/04/2014)  . [DISCONTINUED] ZEGERID 40-1100 MG per capsule Take 1 capsule by mouth daily.   :  Review of Systems:  Out of a complete 14 point review of systems, all are reviewed and negative with the exception of these symptoms as listed below:   Review of Systems  Constitutional: Positive for  fatigue.  Skin:       Birth marks  Allergic/Immunologic: Positive for environmental allergies.  Neurological: Positive for headaches.       Insomnia, snoring, restless legs    Objective:  Neurologic Exam  Physical Exam Physical Examination:   Filed Vitals:   12/04/14 0900  BP: 150/90  Pulse: 82  Temp: 97.8 F (36.6 C)    General Examination: The patient is a very pleasant 58 y.o. female in no acute distress. She appears well-developed and well-nourished and well groomed.   HEENT: Normocephalic, atraumatic, pupils are equal, round and reactive to light and accommodation. Funduscopic exam is normal with sharp  disc margins noted. Extraocular tracking is good without limitation to gaze excursion or nystagmus noted. Normal smooth pursuit is noted. Hearing is grossly intact. Tympanic membranes are clear bilaterally. Face is symmetric with normal facial animation and normal facial sensation. Speech is clear with no dysarthria noted. There is no hypophonia. There is no lip, neck/head, jaw or voice tremor. Neck is supple with full range of passive and active motion. There are no carotid bruits on auscultation. Oropharynx exam reveals: moderate mouth dryness, adequate dental hygiene and moderate airway crowding, due to narrow airway entry and wider uvula. Mallampati is class II. Tongue protrudes centrally and palate elevates symmetrically. Tonsils are absent. Neck size is 13.25 inches. She has a Mild overbite.   Chest: Clear to auscultation without wheezing, rhonchi or crackles noted.  Heart: S1+S2+0, regular and normal without murmurs, rubs or gallops noted.   Abdomen: Soft, non-tender and non-distended with normal bowel sounds appreciated on auscultation.  Extremities: There is no pitting edema in the distal lower extremities bilaterally. Pedal pulses are intact.  Skin: Warm and dry without trophic changes noted. There are no varicose veins.  Musculoskeletal: exam reveals no obvious joint  deformities, tenderness or joint swelling or erythema.   Neurologically:  Mental status: The patient is awake, alert and oriented in all 4 spheres. Her immediate and remote memory, attention, language skills and fund of knowledge are appropriate. There is no evidence of aphasia, agnosia, apraxia or anomia. Speech is clear with normal prosody and enunciation. Thought process is linear. Mood is normal and affect is normal.  Cranial nerves II - XII are as described above under HEENT exam. In addition: shoulder shrug is normal with equal shoulder height noted. Motor exam: Normal bulk, strength and tone is noted. There is no drift, tremor or rebound. Romberg is negative. Reflexes are 2+ throughout. Babinski: Toes are flexor bilaterally. Fine motor skills and coordination: intact with normal finger taps, normal hand movements, normal rapid alternating patting, normal foot taps and normal foot agility.  Cerebellar testing: No dysmetria or intention tremor on finger to nose testing. Heel to shin is unremarkable bilaterally. There is no truncal or gait ataxia.  Sensory exam: intact to light touch, pinprick, vibration, temperature sense in the upper and lower extremities.  Gait, station and balance: She stands easily. No veering to one side is noted. No leaning to one side is noted. Posture is age-appropriate and stance is narrow based. Gait shows normal stride length and normal pace. No problems turning are noted. She turns en bloc. Tandem walk is unremarkable.    Assessment and Plan:   In summary, Lorraine Wells is a very pleasant 58 y.o.-year old female with an underlying medical history of reflux disease, restrictive lung disease, and allergies, who reports snoring, nonrestorative sleep and excessive daytime somnolence . In addition she reports frequent morning headaches, nocturia, restless leg symptoms and leg kicking in her sleep in keeping with periodic leg movements in the context of RLS.  Her history  and physical exam are concerning for obstructive sleep apnea (OSA). She had a recent overnight pulse oximetry test which showed time below 88% saturation of over 17 minutes. Her baseline oxygen saturation of around 92% is probably in part linked to her chronic smoking. I had a long chat with the patient about my findings and the diagnosis of OSA, its prognosis and treatment options. We talked about medical treatments, surgical interventions and non-pharmacological approaches. I explained in particular the risks and ramifications of untreated moderate to  severe OSA, especially with respect to developing cardiovascular disease down the Road, including congestive heart failure, difficult to treat hypertension, cardiac arrhythmias, or stroke. Even type 2 diabetes has, in part, been linked to untreated OSA. Symptoms of untreated OSA include daytime sleepiness, memory problems, mood irritability and mood disorder such as depression and anxiety, lack of energy, as well as recurrent headaches, especially morning headaches. We talked about smoking cessation and trying to maintain a healthy lifestyle in general, as well as the importance of weight control. I encouraged the patient to eat healthy, exercise daily and keep well hydrated, to keep a scheduled bedtime and wake time routine, to not skip any meals and eat healthy snacks in between meals. I advised the patient not to drive when feeling sleepy. I recommended the following at this time: sleep study with potential positive airway pressure titration. (We will score hypopneas at 4% and split the sleep study into diagnostic and treatment portion, if the estimated. 2 hour AHI is >20/h).   I explained the sleep test procedure to the patient and also outlined possible surgical and non-surgical treatment options of OSA, including the use of a custom-made dental device (which would require a referral to a specialist dentist or oral surgeon), upper airway surgical options,  such as pillar implants, radiofrequency surgery, tongue base surgery, and UPPP (which would involve a referral to an ENT surgeon). Rarely, jaw surgery such as mandibular advancement may be considered.  I also explained the CPAP treatment option to the patient, who indicated that she would be willing to try CPAP if the need arises. I explained the importance of being compliant with PAP treatment, not only for insurance purposes but primarily to improve Her symptoms, and for the patient's long term health benefit, including to reduce Her cardiovascular risks. I answered all her questions today and the patient was in agreement. I would like to see her back after the sleep study is completed and encouraged her to call with any interim questions, concerns, problems or updates.   Thank you very much for allowing me to participate in the care of this nice patient. If I can be of any further assistance to you please do not hesitate to call me at [phone].  Sincerely,   Star Age, MD, PhD

## 2014-12-17 IMAGING — US US ABDOMEN COMPLETE
1 series · 14 of 25 positions shown · non-contrast
Comparison: 09/15/2013

CLINICAL DATA: Fatty liver.

EXAM:
ULTRASOUND ABDOMEN COMPLETE

[Series 1: us abdomen complete · 0.24mm/px · 14 of 91 slices shown]
[im 1/91]
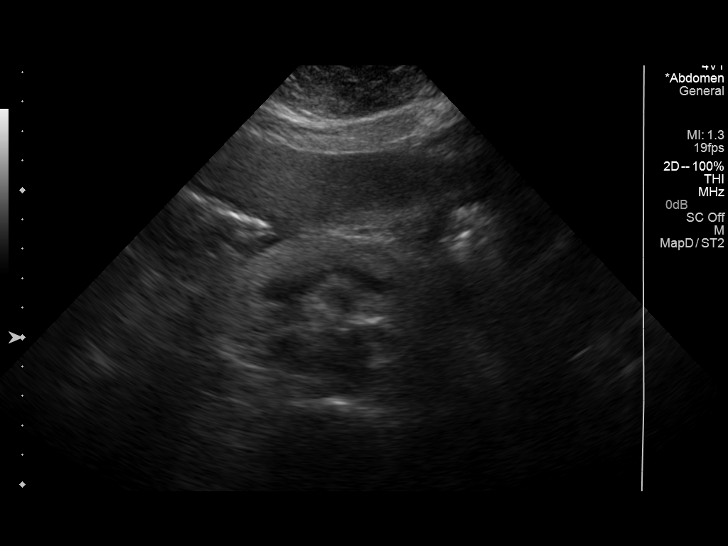
[im 8/91]
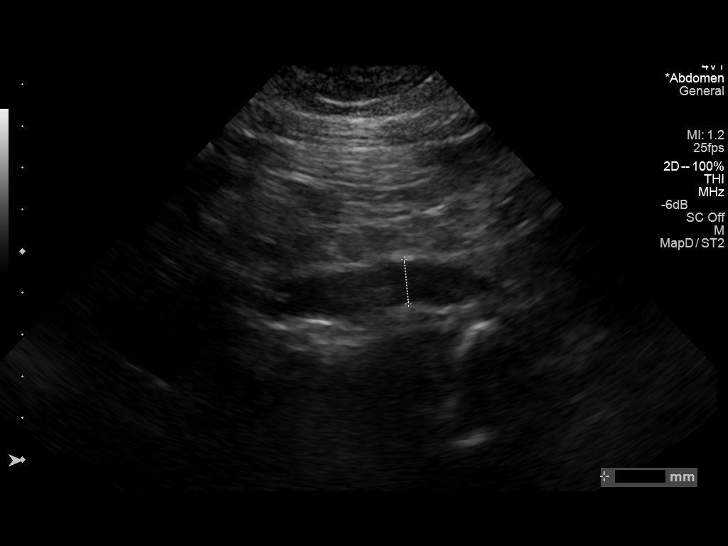
[im 16/91]
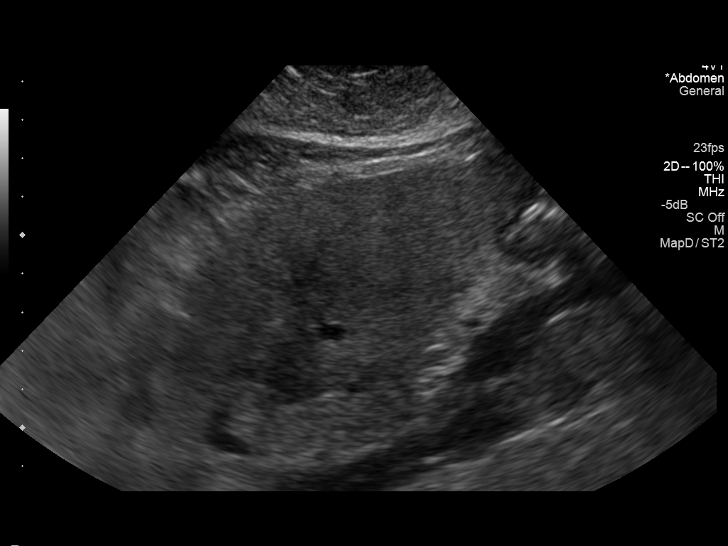
[im 23/91]
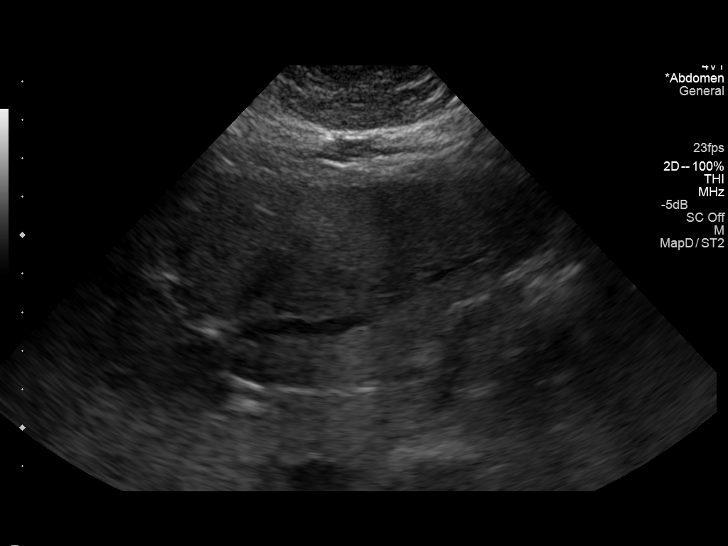
[im 31/91]
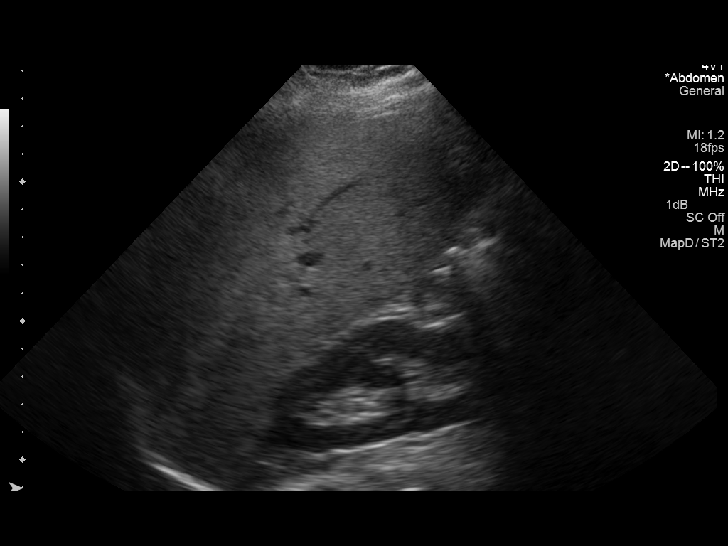
[im 34/91]
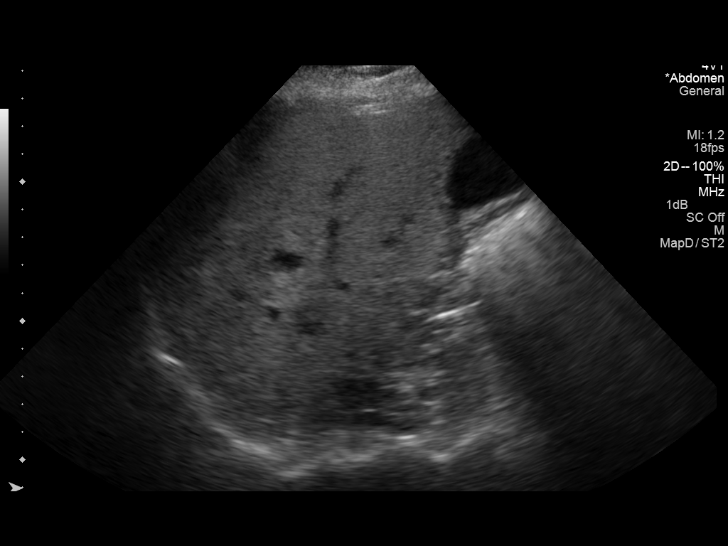
[im 42/91]
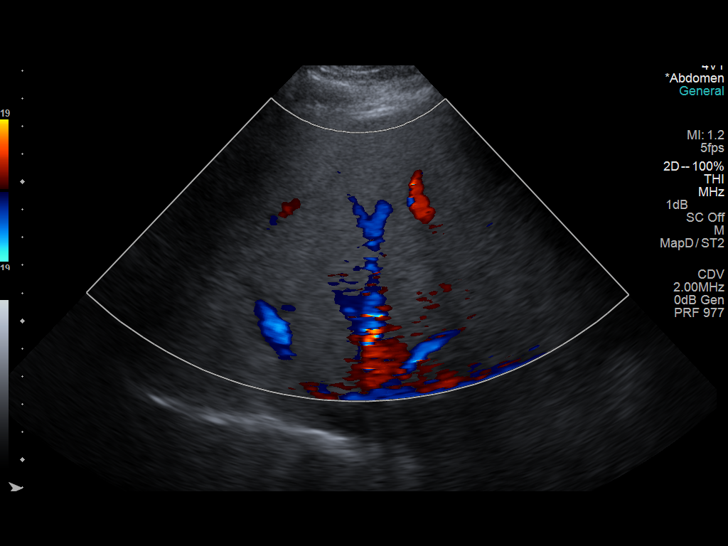
[im 49/91]
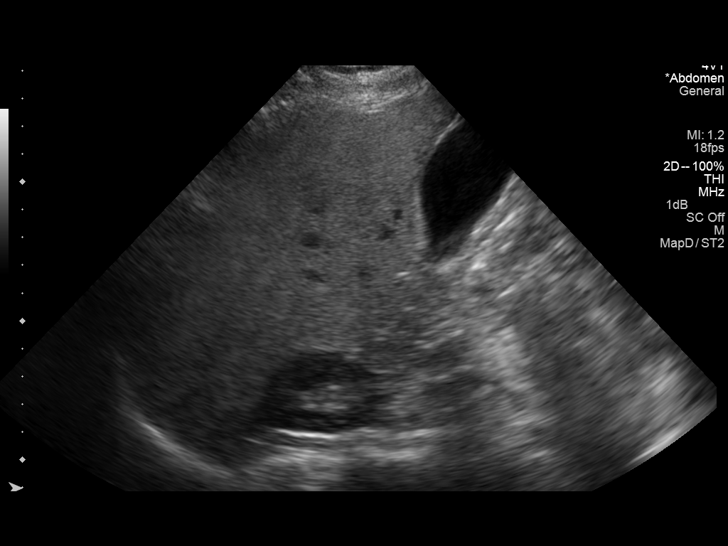
[im 57/91]
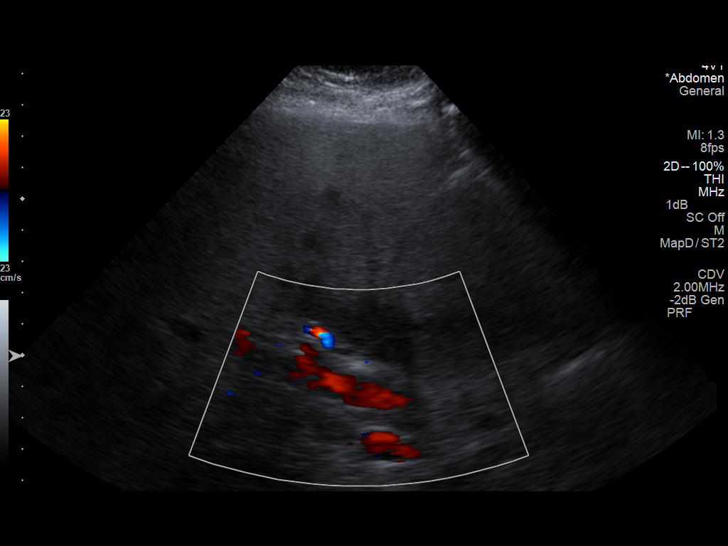
[im 61/91]
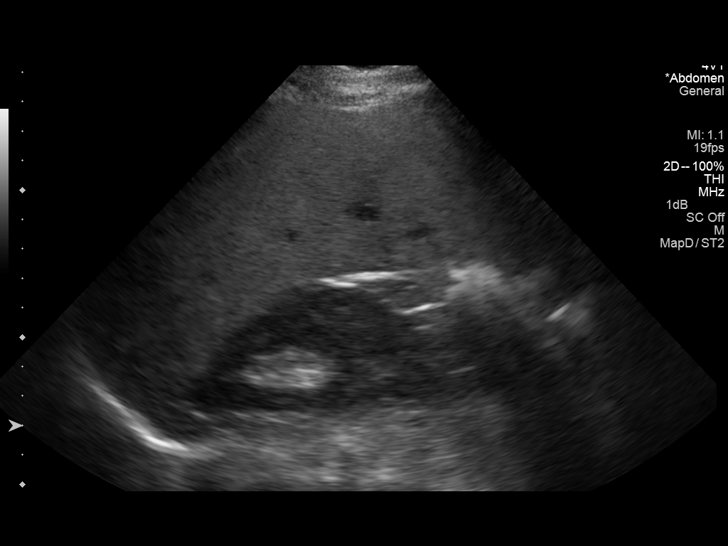
[im 68/91]
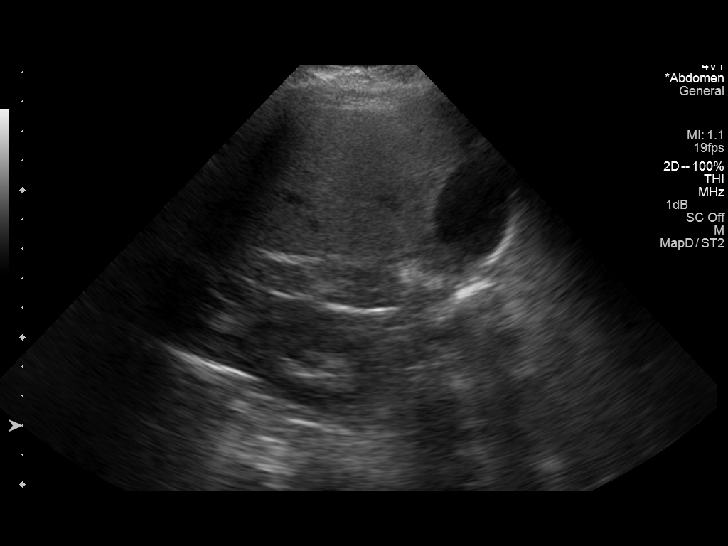
[im 76/91]
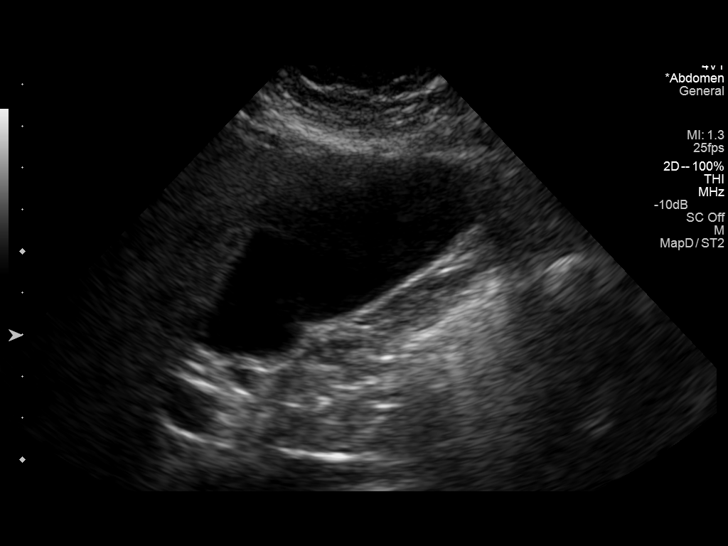
[im 83/91]
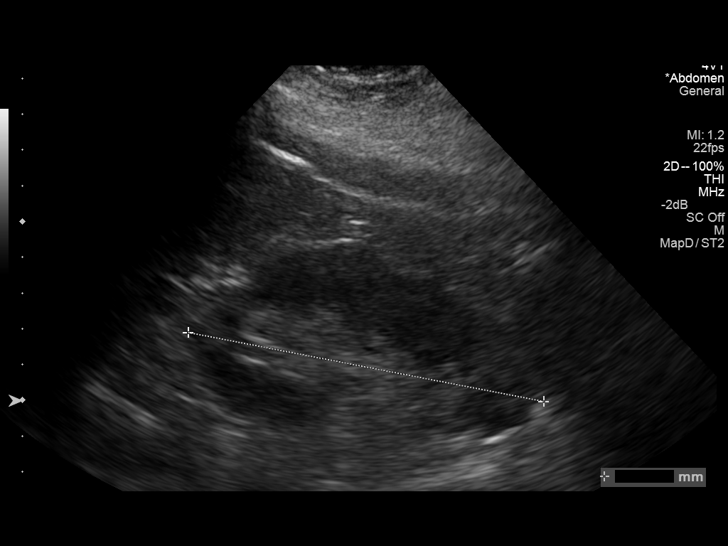
[im 91/91]
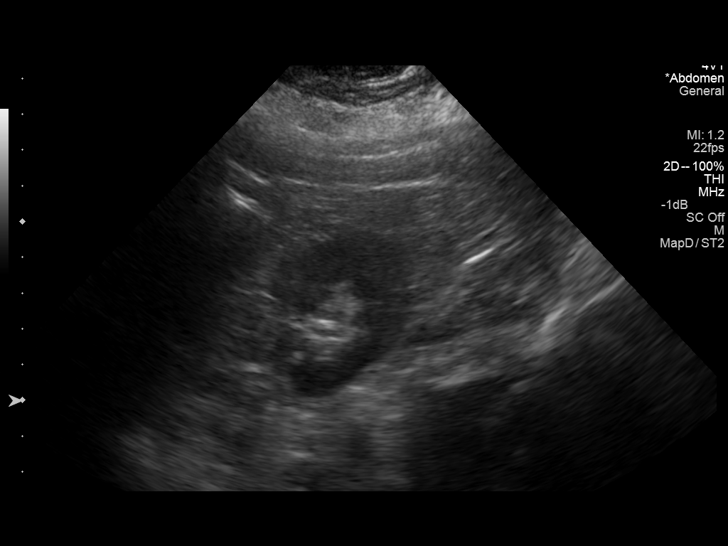

[14 of 25 positions shown; findings below may reference images not displayed]

FINDINGS: Gallbladder:

No gallstones or wall thickening visualized. No sonographic Murphy
sign noted.

Common bile duct:

Diameter: 3.0 mm

Liver:

Diffuse increased echogenicity with poor through transmission and
poor definition of the liver architecture consistent with fatty
infiltration. Stable areas of focal fatty sparing are noted. No
focal hepatic lesions or intrahepatic biliary dilatation.

IVC:

Normal caliber.

Pancreas:

Sonographically unremarkable.

Spleen:

Normal size and echogenicity without focal lesions.

Right Kidney:

Length: 10.2 cm. Normal renal cortical thickness and echogenicity
without focal lesions or hydronephrosis.

Left Kidney:

Length: 10.1 cm. Normal renal cortical thickness and echogenicity
without focal lesions or hydronephrosis.

Abdominal aorta:

Normal caliber.

Other findings:

None.
IMPRESSION: Stable diffuse fatty infiltration of the liver with areas of focal
fatty sparing.

Normal gallbladder and normal caliber common bile duct.

## 2014-12-23 ENCOUNTER — Ambulatory Visit (INDEPENDENT_AMBULATORY_CARE_PROVIDER_SITE_OTHER): Payer: 59 | Admitting: Neurology

## 2014-12-23 DIAGNOSIS — G479 Sleep disorder, unspecified: Secondary | ICD-10-CM

## 2014-12-23 DIAGNOSIS — G4761 Periodic limb movement disorder: Secondary | ICD-10-CM

## 2014-12-23 DIAGNOSIS — G4733 Obstructive sleep apnea (adult) (pediatric): Secondary | ICD-10-CM

## 2014-12-23 DIAGNOSIS — G4734 Idiopathic sleep related nonobstructive alveolar hypoventilation: Secondary | ICD-10-CM

## 2014-12-24 NOTE — Sleep Study (Signed)
Please see the scanned sleep study interpretation located in the Procedure tab within the Chart review section

## 2015-01-01 ENCOUNTER — Telehealth: Payer: Self-pay | Admitting: Neurology

## 2015-01-01 NOTE — Telephone Encounter (Signed)
This patient has an overall normal AHI and evidence of REM related OSA. The patient has significant oxygen desaturations in the absence of respiratory events. This may be secondary to her underlying lung disease and her smoking. She will be advised to follow up with her pulmonologist, or her primary care physician to discuss smoking cessation a referral to a lung specialist if she does not have one yet. She has significant leg kicking in sleep.  pls arrange for FU appt so we can discuss all of this and where to go from here.

## 2015-01-02 ENCOUNTER — Encounter: Payer: Self-pay | Admitting: Neurology

## 2015-01-05 NOTE — Telephone Encounter (Signed)
Patient was contacted and provided the results of her overnight sleep study that revealed nocturnal hypoxemia and severe Periodic Limb Movement Disorder.  Patient was advised a follow up appointment was needed to discuss the results given the complexity of the findings, and the potential need for referral to a pulmonologist.  The patient agreed and a follow up appointment was scheduled for Thursday Jan. 14th at 11:00 am.  Collene Mares was faxed a copy of the results.

## 2015-01-07 ENCOUNTER — Encounter: Payer: Self-pay | Admitting: Neurology

## 2015-01-07 ENCOUNTER — Ambulatory Visit (INDEPENDENT_AMBULATORY_CARE_PROVIDER_SITE_OTHER): Payer: 59 | Admitting: Neurology

## 2015-01-07 VITALS — BP 149/102 | HR 84 | Temp 98.0°F | Resp 16 | Ht 63.0 in | Wt 144.0 lb

## 2015-01-07 DIAGNOSIS — F17200 Nicotine dependence, unspecified, uncomplicated: Secondary | ICD-10-CM

## 2015-01-07 DIAGNOSIS — Z720 Tobacco use: Secondary | ICD-10-CM

## 2015-01-07 DIAGNOSIS — G4761 Periodic limb movement disorder: Secondary | ICD-10-CM

## 2015-01-07 DIAGNOSIS — J4530 Mild persistent asthma, uncomplicated: Secondary | ICD-10-CM

## 2015-01-07 DIAGNOSIS — G2581 Restless legs syndrome: Secondary | ICD-10-CM

## 2015-01-07 DIAGNOSIS — G4734 Idiopathic sleep related nonobstructive alveolar hypoventilation: Secondary | ICD-10-CM

## 2015-01-07 NOTE — Addendum Note (Signed)
Addended by: Star Age on: 01/07/2015 01:13 PM   Modules accepted: Level of Service

## 2015-01-07 NOTE — Patient Instructions (Signed)
We will have you see a lung doctor.  We will not have to treat you for for sleep apnea.  Your sleep is disrupted from leg twitching.  We will do some blood work today and call you. We may use a dopamine agonist, such as Mirapex for Restless legs and leg kicking in sleep in the near future.

## 2015-01-07 NOTE — Progress Notes (Signed)
Subjective:    Patient ID: ABAGAIL LIMB is a 59 y.o. female.  HPI     Interim history:   Ms. Ruvalcaba is a 59 year old right-handed woman with an underlying medical history of reflux disease, restrictive lung disease, smoking, and allergies, who presents for follow-up consultation after her recent sleep study. She is unaccompanied today. I first met her on 12/04/2014 at the request of her primary care provider, at which time she reported snoring, nonrestorative sleep, excessive daytime somnolence and she had recently had an abnormal overnight pulse oximetry test with an average oxygen saturation of 91.8% and a nadir of 86% with time below 88% saturation at 17 minutes. I requested that she return for sleep study. She had a diagnostic polysomnogram on 12/23/2014 and went over her test results with her in detail today. Sleep efficiency was 68.3% with a prolonged sleep latency of 59.5 minutes and wake after sleep onset of 83 minutes with moderate sleep fragmentation noted. She had an elevated arousal index of 21.5 per hour primarily because of periodic leg movements. She had an increased percentage of stage II sleep, a decreased percentage of slow-wave sleep and a decreased percentage of REM sleep with a prolonged REM latency of 284 minutes. She had severe periodic leg movements at 49.9 per hour resulting and 13.7 arousals per hour. She had no significant EKG changes or EEG changes. She had intermittent mild to moderate and rare loud snoring. She slept mostly in her lateral positions. Total AHI was normal at 2.5 per hour, rising to 17.6 per hour during REM sleep. Baseline oxygen saturation was 91% with a nadir of 85%. Time below 88% saturation was 23 minutes.  Today, she reports not sleeping well. She has restless leg symptoms sometimes enough to have to get up and walk around. She still smokes. She would like to quit smoking and will enroll in a program through her work. She has never seen a  pulmonologist. She has a diagnosis of asthma for the past 15 years or longer. She also grew up in a household with smoking. She may have had asthmatic symptoms in childhood even. She was treated in December for sinus infection with a Z-Pak. Sometimes she takes clonazepam at night but has residual daytime sleepiness from it. She takes it maybe twice a month on average.  She had an overnight pulse oximetry test on 11/10/2014: total valid test time was 9 hours and 6 minutes. Average oxygen saturation 91.8%. Lowest oxygen saturation was 86%. Time below 88% saturation was 17 minutes and 32 seconds. Her BP is borderline up today and she reports that she was taken off of HCTZ some 6 months d/t low potassium.   She smokes about 10 cigarettes per day. She used to smoke a pack a day. She would like to quit smoking. She was able to quit smoking in the past for a whole year and one time for 6 months. She drinks alcohol rarely. She drinks one cup of coffee in the morning typically. She does not typically drink sodas. Bedtime is usually around 10 PM and while she falls asleep okay, she wakes up about 6-7 times on an average night. She has to go to the bathroom 1 time per night. She often wakes up with a headache. This lasts for an hour usually. In the recent past she had daily morning headaches, now they are about every other day. She endorses restless leg symptoms. These are not every night. She wakes herself up from leg jerking  at times. She has a cousin with obstructive sleep apnea on a CPAP machine. She does not take a nap typically wake time is usually around 6 or 7 AM and she never feels rested when she first wakes up. She has a TV in the bedroom and sometimes watches TV at night but does not watch it every night and TV does not stay on all night. She typically is a side sleeper. She has reflux symptoms at night when she sleeps on her back.   Her Past Medical History Is Significant For: Past Medical History   Diagnosis Date  . Hypertension   . Asthma   . Anxiety   . Kidney stones   . Depression   . GERD (gastroesophageal reflux disease)   . Migraine   . Hyperlipidemia   . Fatty liver disease, nonalcoholic     Her Past Surgical History Is Significant For: Past Surgical History  Procedure Laterality Date  . Colonoscopy  07/08/02    minimal internal hemorrhoids/otherwise normal  . Esophagogastroduodenoscopy  07/21/09    schatzis ring s/p dilation;small HH/mild erosive reflux/patent pylorus  . Coronary angioplasty    . Tubal ligation    . Tonsillectomy    . Esophagogastroduodenoscopy  08/27/12    3 cm hiatal hernia/gastric polyp, bx fundic gland polyp  . Colonoscopy  08/27/12    Hyperplastic polyp/left-sided diverticulosis. next colonoscopy September 2023    Her Family History Is Significant For: Family History  Problem Relation Age of Onset  . Colon cancer Paternal Aunt 66  . Liver disease Cousin     fatty liver,maternal  . Inflammatory bowel disease Neg Hx   . Heart attack Father   . Kidney cancer Cousin     paternal    Her Social History Is Significant For: History   Social History  . Marital Status: Divorced    Spouse Name: N/A    Number of Children: 1  . Years of Education: 12   Occupational History  .  [employer]   Social History Main Topics  . Smoking status: Current Some Day Smoker -- 0.30 packs/day    Types: Cigarettes  . Smokeless tobacco: Never Used  . Alcohol Use: 0.0 oz/week    0 Not specified per week     Comment: occas.  . Drug Use: No  . Sexual Activity: None   Other Topics Concern  . None   Social History Narrative   Consumes 1 cup of caffeine daily    Her Allergies Are:  Allergies  Allergen Reactions  . Seasonal Ic [Cholestatin]     Itchy eyes , runny nose  :   Her Current Medications Are:  Outpatient Encounter Prescriptions as of 01/07/2015  Medication Sig  . acetaminophen (TYLENOL) 500 MG tablet Take 500 mg by mouth every 6  (six) hours as needed for pain.  Marland Kitchen amLODipine (NORVASC) 5 MG tablet Take 5 mg by mouth daily.   Marland Kitchen aspirin 81 MG tablet Take 81 mg by mouth daily.  Marland Kitchen azithromycin (ZITHROMAX) 250 MG tablet   . clonazePAM (KLONOPIN) 0.5 MG tablet Take 0.5 mg by mouth 2 (two) times daily as needed for anxiety. Sleep  . esomeprazole (NEXIUM) 40 MG capsule Take 40 mg by mouth daily at 12 noon.  Marland Kitchen ipratropium-albuterol (DUONEB) 0.5-2.5 (3) MG/3ML SOLN Take 3 mLs by nebulization every 6 (six) hours as needed.  . montelukast (SINGULAIR) 10 MG tablet Take 10 mg by mouth at bedtime.   Marland Kitchen PROAIR HFA 108 (90  BASE) MCG/ACT inhaler Inhale 1 puff into the lungs every 4 (four) hours as needed. Shortness of Breath  . simvastatin (ZOCOR) 10 MG tablet Take 10 mg by mouth at bedtime.   :  Review of Systems:  Out of a complete 14 point review of systems, all are reviewed and negative with the exception of these symptoms as listed below:   Review of Systems  Neurological:       Not sleeping well at night    Objective:  Neurologic Exam  Physical Exam Physical Examination:   Filed Vitals:   01/07/15 1058  BP: 149/102  Pulse: 84  Temp:   Resp: 16    General Examination: The patient is a very pleasant 59 y.o. female in no acute distress. She appears well-developed and well-nourished and well groomed.   HEENT: Normocephalic, atraumatic, pupils are equal, round and reactive to light and accommodation. Funduscopic exam is normal with sharp disc margins noted. Extraocular tracking is good without limitation to gaze excursion or nystagmus noted. Normal smooth pursuit is noted. Hearing is grossly intact. Face is symmetric with normal facial animation and normal facial sensation. Speech is clear with no dysarthria noted. There is no hypophonia. There is no lip, neck/head, jaw or voice tremor. Neck is supple with full range of passive and active motion. There are no carotid bruits on auscultation. Oropharynx exam reveals: moderate  mouth dryness, adequate dental hygiene and moderate airway crowding, due to narrow airway entry and wider uvula. Mallampati is class II. Tongue protrudes centrally and palate elevates symmetrically. Tonsils are absent.    Chest: Clear to auscultation without wheezing, rhonchi or crackles noted.  Heart: S1+S2+0, regular and normal without murmurs, rubs or gallops noted.   Abdomen: Soft, non-tender and non-distended with normal bowel sounds appreciated on auscultation.  Extremities: There is no pitting edema in the distal lower extremities bilaterally. Pedal pulses are intact.  Skin: Warm and dry without trophic changes noted. There are no varicose veins.  Musculoskeletal: exam reveals no obvious joint deformities, tenderness or joint swelling or erythema.   Neurologically:  Mental status: The patient is awake, alert and oriented in all 4 spheres. Her immediate and remote memory, attention, language skills and fund of knowledge are appropriate. There is no evidence of aphasia, agnosia, apraxia or anomia. Speech is clear with normal prosody and enunciation. Thought process is linear. Mood is normal and affect is normal.  Cranial nerves II - XII are as described above under HEENT exam. In addition: shoulder shrug is normal with equal shoulder height noted. Motor exam: Normal bulk, strength and tone is noted. There is no drift, tremor or rebound. Romberg is negative. Reflexes are 2+ throughout. Fine motor skills and coordination: intact with normal finger taps, normal hand movements, normal rapid alternating patting, normal foot taps and normal foot agility.  Cerebellar testing: No dysmetria or intention tremor on finger to nose testing. Heel to shin is unremarkable bilaterally. There is no truncal or gait ataxia.  Sensory exam: intact to light touch, pinprick, vibration, temperature sense in the upper and lower extremities.  Gait, station and balance: She stands easily. No veering to one side is  noted. No leaning to one side is noted. Posture is age-appropriate and stance is narrow based. Gait shows normal stride length and normal pace. No problems turning are noted. She turns en bloc. Tandem walk is unremarkable.    Assessment and Plan:   In summary, Terilynn Buresh Wisinski is a very pleasant 59 year old female with  an underlying medical history of reflux disease, restrictive lung disease, and allergies, who  presents for follow-up consultation of her sleep disturbance, including nonrestorative sleep, daytime somnolence, nocturia, restless leg symptoms and leg twitching at night. Her recent sleep study did not show any significant obstructive sleep apnea but she did have some REM related sleep disordered breathing. Overall her nighttime oxygen saturation was below normal. She has a long-standing history of asthma and smoking. She is advised to quit smoking. Furthermore, would like for her to see a pulmonologist since she has never seen one and carries a diagnosis of asthma. As far as her sleep disturbance, she had significant periodic leg movements of sleep with arousals. I suggested further workup with iron studies at this time but we may also resort to using a dopamine agonists in the near future. At this juncture, we will call her with her blood test results and revisit at the time the possibility of starting something like Mirapex low-dose at night. She is in agreement. I will see her back routinely in 2-3 months, sooner if need be. I answered all her questions today and we went over her test results in detail as well. She is agreeable to seeing a lung doctor for optimization of her asthma treatment and she is motivated to quit smoking as well for which I commended her.

## 2015-01-08 ENCOUNTER — Telehealth: Payer: Self-pay | Admitting: Neurology

## 2015-01-08 DIAGNOSIS — G2581 Restless legs syndrome: Secondary | ICD-10-CM

## 2015-01-08 LAB — CBC WITH DIFFERENTIAL
Basophils Absolute: 0.1 10*3/uL (ref 0.0–0.2)
Basos: 1 %
Eos: 4 %
Eosinophils Absolute: 0.3 10*3/uL (ref 0.0–0.4)
HCT: 41.9 % (ref 34.0–46.6)
Hemoglobin: 13.9 g/dL (ref 11.1–15.9)
IMMATURE GRANS (ABS): 0 10*3/uL (ref 0.0–0.1)
Immature Granulocytes: 0 %
LYMPHS ABS: 2.3 10*3/uL (ref 0.7–3.1)
Lymphs: 32 %
MCH: 28.1 pg (ref 26.6–33.0)
MCHC: 33.2 g/dL (ref 31.5–35.7)
MCV: 85 fL (ref 79–97)
MONOCYTES: 4 %
Monocytes Absolute: 0.3 10*3/uL (ref 0.1–0.9)
NEUTROS ABS: 4 10*3/uL (ref 1.4–7.0)
Neutrophils Relative %: 59 %
PLATELETS: 283 10*3/uL (ref 150–379)
RBC: 4.94 x10E6/uL (ref 3.77–5.28)
RDW: 14.5 % (ref 12.3–15.4)
WBC: 7 10*3/uL (ref 3.4–10.8)

## 2015-01-08 LAB — IRON AND TIBC
IRON: 59 ug/dL (ref 35–155)
Iron Saturation: 18 % (ref 15–55)
TIBC: 325 ug/dL (ref 250–450)
UIBC: 266 ug/dL (ref 150–375)

## 2015-01-08 LAB — TSH: TSH: 1.69 u[IU]/mL (ref 0.450–4.500)

## 2015-01-08 LAB — FERRITIN: FERRITIN: 59 ng/mL (ref 15–150)

## 2015-01-08 MED ORDER — PRAMIPEXOLE DIHYDROCHLORIDE 0.125 MG PO TABS: ORAL_TABLET | ORAL | Status: DC

## 2015-01-08 NOTE — Telephone Encounter (Signed)
I called the patient.  Relayed Dr Guadelupe Sabin message.  She verbalized understanding and will callus back if anything further is needed.

## 2015-01-08 NOTE — Progress Notes (Signed)
Quick Note:  Labs, including iron studies are fine. pls notify patient. I'm going to have [facility] call her regarding starting a medication for her restless legs. Please give her heads up as well. Star Age, MD, PhD [facility] ([facility]  ______

## 2015-01-08 NOTE — Telephone Encounter (Signed)
Please call patient: As discussed during her recent clinic visit, I would like for her to start a medication for restless leg syndrome and leg twitching at night. Mirapex (generic name: pramipexole) 0.125 mg: Take 1 pill each night for 1 week, the 2 pills each night for 1 week, then 3 pills each night thereafter.  Please ask her to try to take the medication 90-120 minutes before projected bedtime and keep a scheduled bedtime.  Common side effects reported are: Sedation, sleepiness, nausea, vomiting, and rare side effects are confusion, hallucinations, swelling in legs, and abnormal behaviors, including impulse control problems, which can manifest as excessive eating, obsessions with food or gambling, or hypersexuality.

## 2015-01-21 ENCOUNTER — Ambulatory Visit (INDEPENDENT_AMBULATORY_CARE_PROVIDER_SITE_OTHER): Payer: 59 | Admitting: Internal Medicine

## 2015-01-21 ENCOUNTER — Encounter: Payer: Self-pay | Admitting: Internal Medicine

## 2015-01-21 VITALS — BP 126/76 | HR 78 | Temp 98.1°F | Ht 62.0 in | Wt 136.0 lb

## 2015-01-21 DIAGNOSIS — J449 Chronic obstructive pulmonary disease, unspecified: Secondary | ICD-10-CM

## 2015-01-21 DIAGNOSIS — F17210 Nicotine dependence, cigarettes, uncomplicated: Secondary | ICD-10-CM | POA: Insufficient documentation

## 2015-01-21 DIAGNOSIS — Z720 Tobacco use: Secondary | ICD-10-CM

## 2015-01-21 DIAGNOSIS — G4734 Idiopathic sleep related nonobstructive alveolar hypoventilation: Secondary | ICD-10-CM | POA: Insufficient documentation

## 2015-01-21 MED ORDER — BUDESONIDE-FORMOTEROL FUMARATE 160-4.5 MCG/ACT IN AERO: INHALATION_SPRAY | RESPIRATORY_TRACT | Status: DC

## 2015-01-21 NOTE — Assessment & Plan Note (Signed)
Suspect she has at least mild copd/ab and still needs daily saba likely due to slow breo effect  The proper method of use, as well as anticipated side effects, of a metered-dose inhaler are discussed and demonstrated to the patient. Improved effectiveness after extensive coaching during this visit to a level of approximately  75% so should give symbicort 160 a try and see if reduces need for saba, esp the am dose

## 2015-01-21 NOTE — Assessment & Plan Note (Signed)
>   3 min discussion I reviewed the Flethcher curve with patient that basically indicates  if you quit smoking when your best day FEV1 is still well preserved (as is probably  the case here)  it is highly unlikely you will progress to severe disease and informed the patient there was no medication on the market that has proven to change the curve or the likelihood of progression.  Therefore stopping smoking and maintaining abstinence is the most important aspect of care, not choice of inhalers or for that matter, doctors.

## 2015-01-21 NOTE — Assessment & Plan Note (Signed)
Will need repeat ono RA once we have her airways treated more effectively and if > 5 min cumulative at < 89% then qualifies for noct 02, otherwise not

## 2015-01-21 NOTE — Progress Notes (Signed)
Subjective:     Patient ID: Lorraine Wells, female   DOB: 1956/06/26,    MRN: [id number]  HPI  37 yowf active smoker healthy as child but since in late 40's began needing inhalers daily and sleep study showed noct desat so sent by Dr Rexene Alberts 01/21/2015 to pulmonary clinic 01/21/2015   01/21/2015 1st [facility] office visit/ Nautika Cressey   Chief Complaint  Patient presents with  . Advice Only    Pt referred by Dr. Rexene Alberts after sleep study for low 02 during study 85%. Pt does have hx of asthma. Pt does have cough and wheezing.  cough and sob wore in ams and worse in winter rx with saba daily despite singulair and BREO daily chronically feels needs saba first thing in am to get her day started   No obvious day to day or daytime variabilty or assoc chronic cough or cp or chest tightness, subjective wheeze overt sinus or hb symptoms. No unusual exp hx or h/o childhood pna/ asthma or knowledge of premature birth.  Sleeping ok without nocturnal  or early am exacerbation  of respiratory  c/o's or need for noct saba. Also denies any obvious fluctuation of symptoms with weather or environmental changes or other aggravating or alleviating factors except as outlined above   Current Medications, Allergies, Complete Past Medical History, Past Surgical History, Family History, and Social History were reviewed in [employer] record.  ROS  The following are not active complaints unless bolded sore throat, dysphagia, dental problems, itching, sneezing,  nasal congestion or excess/ purulent secretions, ear ache,   fever, chills, sweats, unintended wt loss, pleuritic or exertional cp, hemoptysis,  orthopnea pnd or leg swelling, presyncope, palpitations, heartburn, abdominal pain, anorexia, nausea, vomiting, diarrhea  or change in bowel or urinary habits, change in stools or urine, dysuria,hematuria,  rash, arthralgias, visual complaints, headache, numbness weakness or ataxia or problems with  walking or coordination,  change in mood/affect or memory.            Review of Systems     Objective:   Physical Exam  amb wf nad  Wt Readings from Last 3 Encounters:  01/21/15 136 lb (61.689 kg)  01/07/15 144 lb (65.318 kg)  12/04/14 140 lb (63.504 kg)    Vital signs reviewed   HEENT: nl dentition, turbinates, and orophanx. Nl external ear canals without cough reflex   NECK :  without JVD/Nodes/TM/ nl carotid upstrokes bilaterally   LUNGS: no acc muscle use, clear to A and P bilaterally without cough on insp or exp maneuvers   CV:  RRR  no s3 or murmur or increase in P2, no edema   ABD:  soft and nontender with nl excursion in the supine position. No bruits or organomegaly, bowel sounds nl  MS:  warm without deformities, calf tenderness, cyanosis or clubbing  SKIN: warm and dry without lesions    NEURO:  alert, approp, no deficits   CXR:  11/10/14  I personally reviewed images and agree with radiology impression as follows:   No active cardiopulmonary disease.       Assessment:

## 2015-01-21 NOTE — Patient Instructions (Addendum)
Try off BREO and on symbicort 160 Take 2 puffs first thing in am and then another 2 puffs about 12 hours later. (if prefer Breo fine to resume it)   Only use your albuterol as a rescue medication to be used if you can't catch your breath by resting or doing a relaxed purse lip breathing pattern.  - The less you use it, the better it will work when you need it. - Ok to use up to 2 puffs  every 4 hours if you must but call for immediate appointment if use goes up over your usual need - Don't leave home without it !!  (think of it like the spare tire for your car)   The key is to stop smoking completely before smoking completely stops you!   Please schedule a follow up office visit in 4 weeks, sooner if needed with pfts

## 2015-02-18 ENCOUNTER — Other Ambulatory Visit: Payer: Self-pay | Admitting: Internal Medicine

## 2015-02-18 DIAGNOSIS — R0600 Dyspnea, unspecified: Secondary | ICD-10-CM

## 2015-02-19 ENCOUNTER — Encounter: Payer: Self-pay | Admitting: Internal Medicine

## 2015-02-19 ENCOUNTER — Ambulatory Visit (INDEPENDENT_AMBULATORY_CARE_PROVIDER_SITE_OTHER): Payer: 59 | Admitting: Internal Medicine

## 2015-02-19 VITALS — BP 122/68 | HR 82 | Ht 63.0 in | Wt 142.6 lb

## 2015-02-19 DIAGNOSIS — J449 Chronic obstructive pulmonary disease, unspecified: Secondary | ICD-10-CM

## 2015-02-19 DIAGNOSIS — R0600 Dyspnea, unspecified: Secondary | ICD-10-CM

## 2015-02-19 DIAGNOSIS — G4734 Idiopathic sleep related nonobstructive alveolar hypoventilation: Secondary | ICD-10-CM

## 2015-02-19 LAB — PULMONARY FUNCTION TEST
DL/VA % PRED: 84 %
DL/VA: 3.97 ml/min/mmHg/L
DLCO unc % pred: 85 %
DLCO unc: 19.55 ml/min/mmHg
FEF 25-75 Post: 1.01 L/sec
FEF 25-75 Pre: 0.82 L/sec
FEF2575-%CHANGE-POST: 23 %
FEF2575-%PRED-POST: 43 %
FEF2575-%Pred-Pre: 35 %
FEV1-%Change-Post: 3 %
FEV1-%Pred-Post: 70 %
FEV1-%Pred-Pre: 68 %
FEV1-Post: 1.75 L
FEV1-Pre: 1.7 L
FEV1FVC-%Change-Post: -1 %
FEV1FVC-%Pred-Pre: 85 %
FEV6-%CHANGE-POST: 3 %
FEV6-%Pred-Post: 84 %
FEV6-%Pred-Pre: 81 %
FEV6-POST: 2.61 L
FEV6-Pre: 2.51 L
FEV6FVC-%Change-Post: 0 %
FEV6FVC-%PRED-PRE: 101 %
FEV6FVC-%Pred-Post: 100 %
FVC-%Change-Post: 4 %
FVC-%PRED-POST: 83 %
FVC-%PRED-PRE: 80 %
FVC-POST: 2.68 L
FVC-PRE: 2.55 L
Post FEV1/FVC ratio: 65 %
Post FEV6/FVC ratio: 97 %
Pre FEV1/FVC ratio: 66 %
Pre FEV6/FVC Ratio: 98 %
RV % pred: 151 %
RV: 2.92 L
TLC % pred: 114 %
TLC: 5.6 L

## 2015-02-19 NOTE — Progress Notes (Signed)
PFT done today. 

## 2015-02-19 NOTE — Progress Notes (Signed)
Subjective:     Patient ID: Lorraine Wells, female   DOB: Aug 04, 1956,    MRN: [id number]    Brief patient profile:  17 yowf active smoker healthy as child but since in late 40's began needing inhalers daily and sleep study showed noct desat so sent by Dr Lorraine Wells 01/21/2015 to pulmonary clinic 01/21/2015    History of Present Illness  01/21/2015 1st [facility] office visit/ Lorraine Wells   Chief Complaint  Patient presents with  . Advice Only    Pt referred by Dr. Rexene Wells after sleep study for low 02 during study 85%. Pt does have hx of asthma. Pt does have cough and wheezing.  cough and sob wore in ams and worse in winter rx with saba daily despite singulair and BREO daily chronically feels needs saba first thing in am to get her day started  rec Try off BREO and on symbicort 160 Take 2 puffs first thing in am and then another 2 puffs about 12 hours later. (if prefer Breo fine to resume it)  Only use your albuterol as a rescue medication    02/19/2015 f/u ov/Lorraine Wells re: COPD II  Chief Complaint  Patient presents with  . Follow-up    PFT done today. SOB and cough much better. She has used neb x 2 since the last visit.    likes symbicort better than BREO, esp am effect/ very rare need for saba now   No obvious day to day or daytime variabilty or assoc chronic cough or cp or chest tightness, subjective wheeze overt sinus or hb symptoms. No unusual exp hx or h/o childhood pna/ asthma or knowledge of premature birth.  Sleeping ok without nocturnal  or early am exacerbation  of respiratory  c/o's or need for noct saba. Also denies any obvious fluctuation of symptoms with weather or environmental changes or other aggravating or alleviating factors except as outlined above   Current Medications, Allergies, Complete Past Medical History, Past Surgical History, Family History, and Social History were reviewed in [employer] record.  ROS  The following are not active complaints  unless bolded sore throat, dysphagia, dental problems, itching, sneezing,  nasal congestion or excess/ purulent secretions, ear ache,   fever, chills, sweats, unintended wt loss, pleuritic or exertional cp, hemoptysis,  orthopnea pnd or leg swelling, presyncope, palpitations, heartburn, abdominal pain, anorexia, nausea, vomiting, diarrhea  or change in bowel or urinary habits, change in stools or urine, dysuria,hematuria,  rash, arthralgias, visual complaints, headache, numbness weakness or ataxia or problems with walking or coordination,  change in mood/affect or memory.                Objective:   Physical Exam  amb wf nad   02/19/2015        143  Wt Readings from Last 3 Encounters:  01/21/15 136 lb (61.689 kg)  01/07/15 144 lb (65.318 kg)  12/04/14 140 lb (63.504 kg)    Vital signs reviewed   HEENT: nl dentition, turbinates, and orophanx. Nl external ear canals without cough reflex   NECK :  without JVD/Nodes/TM/ nl carotid upstrokes bilaterally   LUNGS: no acc muscle use, clear to A and P bilaterally without cough on insp or exp maneuvers   CV:  RRR  no s3 or murmur or increase in P2, no edema   ABD:  soft and nontender with nl excursion in the supine position. No bruits or organomegaly, bowel sounds nl  MS:  warm without deformities,  calf tenderness, cyanosis or clubbing  SKIN: warm and dry without lesions    NEURO:  alert, approp, no deficits   CXR:  11/10/14  I personally reviewed images and agree with radiology impression as follows:   No active cardiopulmonary disease.       Assessment:     Outpatient Encounter Prescriptions as of 02/19/2015  Medication Sig  . acetaminophen (TYLENOL) 500 MG tablet Take 500 mg by mouth every 6 (six) hours as needed for pain.  Marland Kitchen amLODipine (NORVASC) 5 MG tablet Take 10 mg by mouth daily.   Marland Kitchen aspirin 81 MG tablet Take 81 mg by mouth daily.  . budesonide-formoterol (SYMBICORT) 160-4.5 MCG/ACT inhaler Take 2 puffs first  thing in am and then another 2 puffs about 12 hours later.  . clonazePAM (KLONOPIN) 0.5 MG tablet Take 0.5 mg by mouth 2 (two) times daily as needed for anxiety. Sleep  . esomeprazole (NEXIUM) 40 MG capsule Take 40 mg by mouth daily at 12 noon.  Marland Kitchen ipratropium-albuterol (DUONEB) 0.5-2.5 (3) MG/3ML SOLN Take 3 mLs by nebulization every 6 (six) hours as needed.  . montelukast (SINGULAIR) 10 MG tablet Take 10 mg by mouth at bedtime.   . pramipexole (MIRAPEX) 0.125 MG tablet Take 1 pill each night for 1 week, the 2 pills each night for 1 week, then 3 pills each night thereafter. (Patient taking differently: Take 0.125 mg by mouth at bedtime. )  . PROAIR HFA 108 (90 BASE) MCG/ACT inhaler Inhale 1 puff into the lungs every 4 (four) hours as needed. Shortness of Breath  . [DISCONTINUED] esomeprazole (NEXIUM) 40 MG capsule Take 40 mg by mouth daily at 12 noon.  . [DISCONTINUED] Fluticasone Furoate-Vilanterol 100-25 MCG/INH AEPB Inhale 1 puff into the lungs every morning.

## 2015-02-19 NOTE — Patient Instructions (Addendum)
No change in symbicort (dulera is ok to substititute if required by insurance)  The key is to stop smoking completely before smoking completely stops you - you only have GOLD II copd so it is clearly not too late   If you are satisfied with your treatment plan,  let your doctor know and he/she can either refill your medications or you can return here when your prescription runs out.     If in any way you are not 100% satisfied,  please tell us.  If 100% better, tell your friends!  Pulmonary follow up is as needed   Late add:  Repeat ono RA needed to complete the w/u

## 2015-02-20 NOTE — Assessment & Plan Note (Signed)
-   01/21/2015 p extensive coaching HFA effectiveness =    75% > try symbicort 160 2bid > improved  - PFTs 02/19/2015   FEV1 1.75 ( 70%) ratio 65 and no change p saba and dlco 85%     So despite active smoking >>  only has mild dz at present.  I reviewed the Flethcher curve with patient that basically indicates  if you quit smoking when your best day FEV1 is still well preserved (as is clearly  the case here)  it is highly unlikely you will progress to severe disease and informed the patient there was no medication on the market that has proven to change the curve or the likelihood of progression.  Therefore stopping smoking and maintaining abstinence is the most important aspect of care, not choice of inhalers or for that matter, doctors.    Ok to continue the symbicort 160 indefinitely plus prn saba but this will only address the symptoms, not the progression of the disease, smoothing out the decent in best day fev1 but not the angle of the decline, at least until the day she quits smoking for good.  Pulmonary f/u can be prn

## 2015-02-20 NOTE — Assessment & Plan Note (Signed)
See sleep study 12/23/14   Now that she is improved reasonable to repeat ono RA and see if she still desaturates significantly

## 2015-02-22 ENCOUNTER — Telehealth: Payer: Self-pay | Admitting: *Deleted

## 2015-02-22 DIAGNOSIS — G4734 Idiopathic sleep related nonobstructive alveolar hypoventilation: Secondary | ICD-10-CM

## 2015-02-22 NOTE — Telephone Encounter (Signed)
-----   Message from Tanda Rockers, MD sent at 02/20/2015  8:05 AM EST ----- Repeat ono RA needed to complete the w/u - forgot to talk to her about this or order at [facility]

## 2015-02-22 NOTE — Telephone Encounter (Signed)
ATC, Line was busy x 2  WCB

## 2015-02-24 NOTE — Telephone Encounter (Signed)
Called home number line busy x 3  Called mobile number and LMTCB

## 2015-02-24 NOTE — Telephone Encounter (Signed)
Spoke with the pt and notified of recs per MW She verbalized understanding  Order was sent to [facility]  

## 2015-03-18 ENCOUNTER — Emergency Department (INDEPENDENT_AMBULATORY_CARE_PROVIDER_SITE_OTHER)
Admission: EM | Admit: 2015-03-18 | Discharge: 2015-03-18 | Disposition: A | Discharge status: Home / Self Care | Payer: 59 | Source: Home / Self Care | Attending: Family Medicine | Admitting: Family Medicine

## 2015-03-18 ENCOUNTER — Encounter (HOSPITAL_COMMUNITY): Payer: Self-pay | Admitting: Emergency Medicine

## 2015-03-18 DIAGNOSIS — L739 Follicular disorder, unspecified: Secondary | ICD-10-CM

## 2015-03-18 MED ORDER — DOXYCYCLINE HYCLATE 100 MG PO CAPS: 100.0000 mg | ORAL_CAPSULE | Freq: Two times a day (BID) | ORAL | Status: DC

## 2015-03-18 NOTE — ED Provider Notes (Signed)
Lorraine Wells is a 59 y.o. female who presents to Urgent Care today for right nostril pain and swelling starting over the last 2 days. No fevers or chills nausea vomiting or diarrhea. She's tried some over-the-counter antibiotic ointment which has not helped.   Past Medical History  Diagnosis Date  . Hypertension   . Asthma   . Anxiety   . Kidney stones   . Depression   . GERD (gastroesophageal reflux disease)   . Migraine   . Hyperlipidemia   . Fatty liver disease, nonalcoholic    Past Surgical History  Procedure Laterality Date  . Colonoscopy  07/08/02    minimal internal hemorrhoids/otherwise normal  . Esophagogastroduodenoscopy  07/21/09    schatzis ring s/p dilation;small HH/mild erosive reflux/patent pylorus  . Coronary angioplasty    . Tubal ligation    . Tonsillectomy    . Esophagogastroduodenoscopy  08/27/12    3 cm hiatal hernia/gastric polyp, bx fundic gland polyp  . Colonoscopy  08/27/12    Hyperplastic polyp/left-sided diverticulosis. next colonoscopy September 2023   History  Substance Use Topics  . Smoking status: Current Some Day Smoker -- 0.30 packs/day for 35 years    Types: Cigarettes  . Smokeless tobacco: Never Used  . Alcohol Use: 0.0 oz/week    0 Standard drinks or equivalent per week     Comment: occas.   ROS as above Medications: No current facility-administered medications for this encounter.   Current Outpatient Prescriptions  Medication Sig Dispense Refill  . amLODipine (NORVASC) 5 MG tablet Take 10 mg by mouth daily.     Marland Kitchen aspirin 81 MG tablet Take 81 mg by mouth daily.    . clonazePAM (KLONOPIN) 0.5 MG tablet Take 0.5 mg by mouth 2 (two) times daily as needed for anxiety. Sleep    . esomeprazole (NEXIUM) 40 MG capsule Take 40 mg by mouth daily at 12 noon.    Marland Kitchen acetaminophen (TYLENOL) 500 MG tablet Take 500 mg by mouth every 6 (six) hours as needed for pain.    . budesonide-formoterol (SYMBICORT) 160-4.5 MCG/ACT inhaler Take 2 puffs first  thing in am and then another 2 puffs about 12 hours later. 1 Inhaler 11  . doxycycline (VIBRAMYCIN) 100 MG capsule Take 1 capsule (100 mg total) by mouth 2 (two) times daily. 14 capsule 0  . ipratropium-albuterol (DUONEB) 0.5-2.5 (3) MG/3ML SOLN Take 3 mLs by nebulization every 6 (six) hours as needed.    . montelukast (SINGULAIR) 10 MG tablet Take 10 mg by mouth at bedtime.     . pramipexole (MIRAPEX) 0.125 MG tablet Take 1 pill each night for 1 week, the 2 pills each night for 1 week, then 3 pills each night thereafter. (Patient taking differently: Take 0.125 mg by mouth at bedtime. ) 90 tablet 5  . PROAIR HFA 108 (90 BASE) MCG/ACT inhaler Inhale 1 puff into the lungs every 4 (four) hours as needed. Shortness of Breath     Allergies  Allergen Reactions  . Seasonal Ic [Cholestatin]     Itchy eyes , runny nose     Exam:  BP 144/77 mmHg  Pulse 78  Temp(Src) 98.9 F (37.2 C) (Oral)  Resp 18  SpO2 100% Gen: Well NAD HEENT: EOMI,  MMM swollen and tender internal right nostril. No visible external swelling. He is a turbinates are normal appearing bilaterally. No fluctuance palpated. Lungs: Normal work of breathing. CTABL Heart: RRR no MRG Abd: NABS, Soft. Nondistended, Nontender Exts: Brisk capillary refill, warm  and well perfused.   No results found for this or any previous visit (from the past 24 hour(s)). No results found.  Assessment and Plan: 59 y.o. female with folliculitis or cellulitis. Treat with doxycycline. Follow up with PCP.  Discussed warning signs or symptoms. Please see discharge instructions. Patient expresses understanding.     Gregor Hams, MD 03/18/15 [phone]

## 2015-03-18 NOTE — ED Notes (Signed)
C/o  Having an abscess.   Pain and swelling.  No drainage.  Pt triaged and assessed by provider. Provider in before nurse.

## 2015-03-18 NOTE — Discharge Instructions (Signed)
Thank you for coming in today.   Folliculitis  Folliculitis is redness, soreness, and swelling (inflammation) of the hair follicles. This condition can occur anywhere on the body. People with weakened immune systems, diabetes, or obesity have a greater risk of getting folliculitis. CAUSES  Bacterial infection. This is the most common cause.  Fungal infection.  Viral infection.  Contact with certain chemicals, especially oils and tars. Long-term folliculitis can result from bacteria that live in the nostrils. The bacteria may trigger multiple outbreaks of folliculitis over time. SYMPTOMS Folliculitis most commonly occurs on the scalp, thighs, legs, back, buttocks, and areas where hair is shaved frequently. An early sign of folliculitis is a small, white or yellow, pus-filled, itchy lesion (pustule). These lesions appear on a red, inflamed follicle. They are usually less than 0.2 inches (5 mm) wide. When there is an infection of the follicle that goes deeper, it becomes a boil or furuncle. A group of closely packed boils creates a larger lesion (carbuncle). Carbuncles tend to occur in hairy, sweaty areas of the body. DIAGNOSIS  Your caregiver can usually tell what is wrong by doing a physical exam. A sample may be taken from one of the lesions and tested in a lab. This can help determine what is causing your folliculitis. TREATMENT  Treatment may include:  Applying warm compresses to the affected areas.  Taking antibiotic medicines orally or applying them to the skin.  Draining the lesions if they contain a large amount of pus or fluid.  Laser hair removal for cases of long-lasting folliculitis. This helps to prevent regrowth of the hair. HOME CARE INSTRUCTIONS  Apply warm compresses to the affected areas as directed by your caregiver.  If antibiotics are prescribed, take them as directed. Finish them even if you start to feel better.  You may take over-the-counter medicines to  relieve itching.  Do not shave irritated skin.  Follow up with your caregiver as directed. SEEK IMMEDIATE MEDICAL CARE IF:   You have increasing redness, swelling, or pain in the affected area.  You have a fever. MAKE SURE YOU:  Understand these instructions.  Will watch your condition.  Will get help right away if you are not doing well or get worse. Document Released: 02/19/2002 Document Revised: 06/11/2012 Document Reviewed: 03/12/2012 [facility] Patient Information 2015 [place], [place]. This information is not intended to replace advice given to you by your health care provider. Make sure you discuss any questions you have with your health care provider.

## 2015-04-05 ENCOUNTER — Ambulatory Visit (HOSPITAL_COMMUNITY)
Admission: RE | Admit: 2015-04-05 | Discharge: 2015-04-05 | Disposition: A | Discharge status: Home / Self Care | Payer: 59 | Source: Ambulatory Visit | Attending: Family Medicine | Admitting: Family Medicine

## 2015-04-05 ENCOUNTER — Other Ambulatory Visit (HOSPITAL_COMMUNITY): Payer: Self-pay | Admitting: Family Medicine

## 2015-04-05 DIAGNOSIS — R05 Cough: Secondary | ICD-10-CM | POA: Diagnosis not present

## 2015-04-05 DIAGNOSIS — J449 Chronic obstructive pulmonary disease, unspecified: Secondary | ICD-10-CM | POA: Insufficient documentation

## 2015-04-05 DIAGNOSIS — R509 Fever, unspecified: Secondary | ICD-10-CM | POA: Diagnosis not present

## 2015-04-05 DIAGNOSIS — J069 Acute upper respiratory infection, unspecified: Secondary | ICD-10-CM

## 2015-04-05 DIAGNOSIS — J209 Acute bronchitis, unspecified: Secondary | ICD-10-CM | POA: Insufficient documentation

## 2015-04-05 DIAGNOSIS — R062 Wheezing: Secondary | ICD-10-CM | POA: Diagnosis not present

## 2015-05-17 ENCOUNTER — Other Ambulatory Visit (HOSPITAL_COMMUNITY): Payer: Self-pay | Admitting: Family Medicine

## 2015-05-17 DIAGNOSIS — Z139 Encounter for screening, unspecified: Secondary | ICD-10-CM

## 2015-05-19 ENCOUNTER — Ambulatory Visit (HOSPITAL_COMMUNITY)
Admission: RE | Admit: 2015-05-19 | Discharge: 2015-05-19 | Disposition: A | Discharge status: Home / Self Care | Payer: 59 | Source: Ambulatory Visit | Attending: Family Medicine | Admitting: Family Medicine

## 2015-05-19 DIAGNOSIS — Z780 Asymptomatic menopausal state: Secondary | ICD-10-CM | POA: Diagnosis not present

## 2015-05-19 DIAGNOSIS — Z13820 Encounter for screening for osteoporosis: Secondary | ICD-10-CM | POA: Insufficient documentation

## 2015-05-19 DIAGNOSIS — Z139 Encounter for screening, unspecified: Secondary | ICD-10-CM

## 2015-09-07 ENCOUNTER — Other Ambulatory Visit (HOSPITAL_COMMUNITY): Payer: Self-pay | Admitting: Family Medicine

## 2015-09-07 DIAGNOSIS — Z1231 Encounter for screening mammogram for malignant neoplasm of breast: Secondary | ICD-10-CM

## 2015-09-13 ENCOUNTER — Ambulatory Visit (HOSPITAL_COMMUNITY): Payer: 59

## 2015-09-14 ENCOUNTER — Encounter: Payer: Self-pay | Admitting: Internal Medicine

## 2015-10-04 ENCOUNTER — Ambulatory Visit (HOSPITAL_COMMUNITY)
Admission: RE | Admit: 2015-10-04 | Discharge: 2015-10-04 | Disposition: A | Discharge status: Home / Self Care | Payer: 59 | Source: Ambulatory Visit | Attending: Family Medicine | Admitting: Family Medicine

## 2015-10-04 DIAGNOSIS — Z1231 Encounter for screening mammogram for malignant neoplasm of breast: Secondary | ICD-10-CM | POA: Insufficient documentation

## 2015-10-05 ENCOUNTER — Other Ambulatory Visit: Payer: Self-pay

## 2015-10-05 DIAGNOSIS — R932 Abnormal findings on diagnostic imaging of liver and biliary tract: Secondary | ICD-10-CM

## 2016-02-07 ENCOUNTER — Other Ambulatory Visit: Payer: Self-pay | Admitting: Internal Medicine

## 2016-09-26 ENCOUNTER — Ambulatory Visit (HOSPITAL_COMMUNITY): Payer: 59 | Attending: Family Medicine | Admitting: Physical Therapy

## 2016-09-26 ENCOUNTER — Encounter (HOSPITAL_COMMUNITY): Payer: Self-pay | Admitting: Physical Therapy

## 2016-09-26 DIAGNOSIS — M62838 Other muscle spasm: Secondary | ICD-10-CM | POA: Diagnosis present

## 2016-09-26 DIAGNOSIS — M542 Cervicalgia: Secondary | ICD-10-CM | POA: Insufficient documentation

## 2016-09-26 NOTE — Therapy (Signed)
[facility] [address] [place], [place], [address] Phone: [phone]   Fax:  [phone]  Physical Therapy Evaluation  Patient Details  Name: Lorraine Wells MRN: [id number] Date of Birth: 1956/03/06 Referring Provider: Joni Fears, MD  Encounter Date: 09/26/2016      PT End of Session - 09/26/16 1648    Visit Number 1   Number of Visits 7   Date for PT Re-Evaluation 10/18/16   Authorization Type UHC   Authorization Time Period 09/26/16 to 11/17/177   PT Start Time 1302   PT Stop Time 1342   PT Time Calculation (min) 40 min   Activity Tolerance Patient tolerated treatment well   Behavior During Therapy [facility] for tasks assessed/performed      Past Medical History:  Diagnosis Date  . Anxiety   . Asthma   . Depression   . Fatty liver disease, nonalcoholic   . GERD (gastroesophageal reflux disease)   . Hyperlipidemia   . Hypertension   . Kidney stones   . Migraine     Past Surgical History:  Procedure Laterality Date  . COLONOSCOPY  07/08/02   minimal internal hemorrhoids/otherwise normal  . COLONOSCOPY  08/27/12   Hyperplastic polyp/left-sided diverticulosis. next colonoscopy September 2023  . CORONARY ANGIOPLASTY    . ESOPHAGOGASTRODUODENOSCOPY  07/21/09   schatzis ring s/p dilation;small HH/mild erosive reflux/patent pylorus  . ESOPHAGOGASTRODUODENOSCOPY  08/27/12   3 cm hiatal hernia/gastric polyp, bx fundic gland polyp  . TONSILLECTOMY    . TUBAL LIGATION      There were no vitals filed for this visit.       Subjective Assessment - 09/26/16 1350    Subjective Pt reports pain onset back in March after putting trim around her sister's house. She went to the Dr, was given muscle relaxers, which helped. She noticed the pain came back in April. This lasted 5-6 days and she took more muscle relaxers which alleviated it again. Pain came back in July and again in September. It has only lasted a couple of days at a  time.    Pertinent History Asthma, anxiety, depression, HTN, HLD   How long can you sit comfortably? ~1 hour at work, unlimited at home    How long can you stand comfortably? unlimited    How long can you walk comfortably? unlimited    Diagnostic tests xray: arthritis    Patient Stated Goals improve pain for good    Currently in Pain? Yes  "Minor/tension"   Pain Orientation Left   Pain Descriptors / Indicators Aching;Tightness   Pain Type Acute pain   Pain Radiating Towards none    Pain Onset More than a month ago   Pain Frequency Occasional   Aggravating Factors  carrying something in LUE, Rt lateral flexion    Pain Relieving Factors muscle relaxers, heat, ice   Effect of Pain on Daily Activities limited sitting tolerance at work            [facility] PT Assessment - 09/26/16 0001      Assessment   Medical Diagnosis Lt shoulder/neck pain    Referring Provider Joni Fears, MD   Next MD Visit none unless needed    Prior Therapy none      Precautions   Precautions None     Balance Screen   Has the patient fallen in the past 6 months No   Has the patient had a decrease in activity level because of a fear  of falling?  No   Is the patient reluctant to leave their home because of a fear of falling?  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     Cognition   Overall Cognitive Status Within Functional Limits for tasks assessed     Observation/Other Assessments   Focus on Therapeutic Outcomes (FOTO)  23% limitation     Sensation   Light Touch Appears Intact   Additional Comments denies dizziness, headaches      ROM / Strength   AROM / PROM / Strength AROM;Strength     AROM   AROM Assessment Site Cervical   Cervical Flexion WNL, pain free   Cervical Extension WNL, pain free   Cervical - Right Side Bend 30 deg, pain Lt Upper trap    Cervical - Left Side Bend 32 deg,  pain free   Cervical - Right Rotation WNL, pull Lt upper trap   Cervical - Left Rotation WNL, pain free     Strength   Strength Assessment Site Shoulder   Right/Left Shoulder Right;Left   Right Shoulder Flexion 5/5   Right Shoulder ABduction 5/5   Left Shoulder Flexion 5/5   Left Shoulder ABduction 5/5     Palpation   Palpation comment TTP along Lt upper trap, trigger points noted                    [facility] Adult PT Treatment/Exercise - 09/26/16 0001      Exercises   Exercises Neck     Neck Exercises: Seated   Other Seated Exercise thoracic extension self mob over chair, x3 reps each segment T8 to T2     Manual Therapy   Manual Therapy Myofascial release   Manual therapy comments separate rest of session   Myofascial Release TrP release Lt upper trap, levator scap      Neck Exercises: Stretches   Upper Trapezius Stretch 2 reps;30 seconds                PT Education - 09/26/16 1708    Education provided Yes   Education Details eval findings/POC; importance of improving posture to allow upper trap to relax; initiated HEP; use of lumbar roll at work to improve sitting posture   Person(s) Educated Patient   Methods Explanation;Demonstration;Handout   Comprehension Verbalized understanding          PT Short Term Goals - 09/26/16 1659      PT SHORT TERM GOAL #1   Title Pt will demo consistency and independence with her HEP to improve pain and postural control.    Time 2   Period Weeks   Status New     PT SHORT TERM GOAL #2   Title Pt will demo correct set up and use of lumbar roll to improve sitting posture at work.   Time 2   Period Weeks   Status New           PT Long Term Goals - 09/26/16 1700      PT LONG TERM GOAL #1   Title Pt will demo consistency and independence with her advanced HEP to prepare her for d/c from therapy.   Time 6   Period Weeks   Status New     PT LONG TERM GOAL #2   Title Pt will demo improved cervical ROM  in all directions without  increase in pain/tightness, to allow her to perform daily activity such as driving to work without difficulty.    Time 6   Period Weeks   Status New     PT LONG TERM GOAL #3   Title Pt will present with decreased tenderness with palpation and minimal palpable trigger points along her Lt upper trap to improve activity tolerance.   Time 6   Period Weeks   Status New     PT LONG TERM GOAL #4   Title Pt will report no more than 3/10 pain during the day to allow her to lift a bag of groceries with improved tolerance.   Time 6   Period Weeks   Status New               Plan - 2016-10-19 1651    Clinical Impression Statement Pt is a pleasant 60yo F referred to OPPT concerning Lt shoulder and neck pain of several months. She denies any numbness/tingling in the UE and is not experiencing headaches. She demonstrates full cervical ROM with pull/pain along her Lt upper trap with Rt rotation and Rt lateral flexion. Shoulder strength is WNL and pain free. She demonstrates poor postural control evident by forward head, rounded shoulders and increased thoracic kyphosis and is tender with palpation and noting trigger points along Lt upper trap, which I feel is the source of her pain at this time. Manual techniques were performed with some relief and relaxation following treatment. Provided HEP to address pain as well, with return demonstration of correct technique. Pt would benefit from skilled PT to address her limitations and improve pain/muscle spasm and increase activity tolerance at home and work.   Rehab Potential Good   PT Frequency 1x / week   PT Duration 6 weeks   PT Treatment/Interventions ADLs/Self Care Home Management;Moist Heat;Cryotherapy;Therapeutic exercise;Therapeutic activities;Neuromuscular re-education;Patient/family education;Manual techniques;Passive range of motion;Taping;Dry needling   PT Next Visit Plan review HEP, trigger point release/STM along Lt  upper trap, therex to address postural impairments   PT Home Exercise Plan upper trap stretch 3x30 sec, thoracic ext self mob over chair   Recommended Other Services none    Consulted and Agree with Plan of Care Patient      Patient will benefit from skilled therapeutic intervention in order to improve the following deficits and impairments:  Decreased activity tolerance, Impaired flexibility, Postural dysfunction, Pain, Increased muscle spasms  Visit Diagnosis: Other muscle spasm  Cervicalgia      G-Codes - 10/19/16 1704    Functional Assessment Tool Used FOTO: 23% limited   Functional Limitation Changing and maintaining body position   Changing and Maintaining Body Position Current Status [id number]) At least 20 percent but less than 40 percent impaired, limited or restricted   Changing and Maintaining Body Position Goal Status [id number]) At least 20 percent but less than 40 percent impaired, limited or restricted       Problem List Patient Active Problem List   Diagnosis Date Noted  . COPD GOLD II 01/21/2015  . Cigarette smoker 01/21/2015  . Nocturnal hypoxemia 01/21/2015  . Abnormal ultrasound of liver 09/04/2012  . Epigastric pain 08/05/2012  . Encounter for screening colonoscopy 08/05/2012  . 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 GERD 07/14/2009  . CONSTIPATION, CHRONIC 07/14/2009  . CHEST PAIN, ATYPICAL 07/14/2009  . DYSPHAGIA 07/14/2009    5:12 PM,2016-10-19 Elly Modena PT, DPT [facility], [place],  [id number] Phone: [phone]   Fax:  [phone]  Name: Lorraine Wells MRN: [id number] Date of Birth: 08-28-1956

## 2016-10-05 ENCOUNTER — Ambulatory Visit (HOSPITAL_COMMUNITY): Payer: 59 | Admitting: Physical Therapy

## 2016-10-05 DIAGNOSIS — M62838 Other muscle spasm: Secondary | ICD-10-CM | POA: Diagnosis not present

## 2016-10-05 DIAGNOSIS — M542 Cervicalgia: Secondary | ICD-10-CM

## 2016-10-05 NOTE — Therapy (Signed)
[facility] [address] [place], [place], [address] Phone: [phone]   Fax:  [phone]  Physical Therapy Treatment  Patient Details  Name: Lorraine Wells MRN: [id number] Date of Birth: 09-30-1956 Referring Provider: Joni Fears, MD  Encounter Date: 10/05/2016      PT End of Session - 10/05/16 1747    Visit Number 2   Number of Visits 7   Date for PT Re-Evaluation 10/18/16   Authorization Type UHC   Authorization Time Period 09/26/16 to 11/17/177   PT Start Time [id number]   PT Stop Time 1730   PT Time Calculation (min) 40 min   Activity Tolerance Patient tolerated treatment well   Behavior During Therapy [facility] for tasks assessed/performed      Past Medical History:  Diagnosis Date  . Anxiety   . Asthma   . Depression   . Fatty liver disease, nonalcoholic   . GERD (gastroesophageal reflux disease)   . Hyperlipidemia   . Hypertension   . Kidney stones   . Migraine     Past Surgical History:  Procedure Laterality Date  . COLONOSCOPY  07/08/02   minimal internal hemorrhoids/otherwise normal  . COLONOSCOPY  08/27/12   Hyperplastic polyp/left-sided diverticulosis. next colonoscopy September 2023  . CORONARY ANGIOPLASTY    . ESOPHAGOGASTRODUODENOSCOPY  07/21/09   schatzis ring s/p dilation;small HH/mild erosive reflux/patent pylorus  . ESOPHAGOGASTRODUODENOSCOPY  08/27/12   3 cm hiatal hernia/gastric polyp, bx fundic gland polyp  . TONSILLECTOMY    . TUBAL LIGATION      There were no vitals filed for this visit.      Subjective Assessment - 10/05/16 1659    Subjective Pt reports some soreness for 1-2 days following her evaluation, but has had no issues since then. No pain currently.    Pertinent History [place], anxiety, depression, HTN, HLD   How long can you sit comfortably? ~1 hour at work, unlimited at home    How long can you stand comfortably? unlimited    How long can you walk comfortably? unlimited    Diagnostic tests xray: arthritis    Patient Stated Goals improve pain for good    Currently in Pain? No/denies   Pain Onset More than a month ago                         [facility] Adult PT Treatment/Exercise - 10/05/16 0001      Neck Exercises: Standing   Other Standing Exercises shoulder extension 2x10 with red TB      Neck Exercises: Seated   Neck Retraction 15 reps;3 secs   Other Seated Exercise thoracic extension self mob over chair, x3 reps each segment T8 to T2   Other Seated Exercise scap retraction x15 reps      Manual Therapy   Manual Therapy Soft tissue mobilization   Manual therapy comments separate rest of session   Soft tissue mobilization STM Lt upper trap, levator scap     Neck Exercises: Stretches   Upper Trapezius Stretch 30 seconds;3 reps;Other (comment)  Lt only    Chest Stretch 2 reps;30 seconds                PT Education - 10/05/16 1744    Education provided Yes   Education Details reviewed eval and goals; implications for postural therex and manual techniques; technique with therex   Person(s) Educated Patient   Methods Explanation;Handout;Demonstration   Comprehension Verbalized understanding;Returned  demonstration          PT Short Term Goals - 09/26/16 1659      PT SHORT TERM GOAL #1   Title Pt will demo consistency and independence with her HEP to improve pain and postural control.    Time 2   Period Weeks   Status New     PT SHORT TERM GOAL #2   Title Pt will demo correct set up and use of lumbar roll to improve sitting posture at work.   Time 2   Period Weeks   Status New           PT Long Term Goals - 09/26/16 1700      PT LONG TERM GOAL #1   Title Pt will demo consistency and independence with her advanced HEP to prepare her for d/c from therapy.   Time 6   Period Weeks   Status New     PT LONG TERM GOAL #2   Title Pt will demo improved cervical ROM in all directions without increase in  pain/tightness, to allow her to perform daily activity such as driving to work without difficulty.    Time 6   Period Weeks   Status New     PT LONG TERM GOAL #3   Title Pt will present with decreased tenderness with palpation and minimal palpable trigger points along her Lt upper trap to improve activity tolerance.   Time 6   Period Weeks   Status New     PT LONG TERM GOAL #4   Title Pt will report no more than 3/10 pain during the day to allow her to lift a bag of groceries with improved tolerance.   Time 6   Period Weeks   Status New               Plan - 10/05/16 1747    Clinical Impression Statement Today's session introduced therex to address postural dysfunction. Pt requiring verbal/tactile cues to correct technique with noted upper trap over activation during shoulder scapular retraction. Ended session with manual techniques to address muscle spasm and trigger points with pt reporting no increase in pain by the end of today's session.   Rehab Potential Good   PT Frequency 1x / week   PT Duration 6 weeks   PT Treatment/Interventions ADLs/Self Care Home Management;Moist Heat;Cryotherapy;Therapeutic exercise;Therapeutic activities;Neuromuscular re-education;Patient/family education;Manual techniques;Passive range of motion;Taping;Dry needling   PT Next Visit Plan review HEP, trigger point release/STM along Lt upper trap, therex to address postural impairments   PT Home Exercise Plan upper trap stretch 3x30 sec, thoracic ext self mob over chair   Recommended Other Services none    Consulted and Agree with Plan of Care Patient      Patient will benefit from skilled therapeutic intervention in order to improve the following deficits and impairments:  Decreased activity tolerance, Impaired flexibility, Postural dysfunction, Pain, Increased muscle spasms  Visit Diagnosis: Other muscle spasm  Cervicalgia     Problem List Patient Active Problem List   Diagnosis Date  Noted  . COPD GOLD II 01/21/2015  . Cigarette smoker 01/21/2015  . Nocturnal hypoxemia 01/21/2015  . Abnormal ultrasound of liver 09/04/2012  . Epigastric pain 08/05/2012  . Encounter for screening colonoscopy 08/05/2012  . 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 GERD 07/14/2009  . CONSTIPATION, CHRONIC 07/14/2009  . CHEST PAIN, ATYPICAL 07/14/2009  . DYSPHAGIA 07/14/2009   5:51 PM,10/05/16 Elly Modena PT, DPT [facility] [phone]  [facility]  [place], [place], [address] Phone: [phone]   Fax:  [phone]  Name: Lorraine Wells MRN: [id number] Date of Birth: Feb 13, 1956

## 2016-10-12 ENCOUNTER — Ambulatory Visit (HOSPITAL_COMMUNITY): Payer: 59

## 2016-10-12 DIAGNOSIS — M542 Cervicalgia: Secondary | ICD-10-CM

## 2016-10-12 DIAGNOSIS — M62838 Other muscle spasm: Secondary | ICD-10-CM

## 2016-10-12 NOTE — Therapy (Signed)
[facility] [address] [place], [place], [address] Phone: [phone]   Fax:  [phone]  Physical Therapy Treatment  Patient Details  Name: Lorraine Wells MRN: [id number] Date of Birth: [birth year] Referring Provider: Joni Fears, MD  Encounter Date: 10/12/2016      PT End of Session - 10/12/16 1728    Visit Number 3   Number of Visits 7   Date for PT Re-Evaluation 10/18/16   Authorization Type UHC   Authorization Time Period 09/26/16 to 11/17/177   PT Start Time 1724   PT Stop Time 1808   PT Time Calculation (min) 44 min   Activity Tolerance Patient tolerated treatment well   Behavior During Therapy [facility] for tasks assessed/performed      Past Medical History:  Diagnosis Date  . Anxiety   . Asthma   . Depression   . Fatty liver disease, nonalcoholic   . GERD (gastroesophageal reflux disease)   . Hyperlipidemia   . Hypertension   . Kidney stones   . Migraine     Past Surgical History:  Procedure Laterality Date  . COLONOSCOPY  07/08/02   minimal internal hemorrhoids/otherwise normal  . COLONOSCOPY  08/27/12   Hyperplastic polyp/left-sided diverticulosis. next colonoscopy September 2023  . CORONARY ANGIOPLASTY    . ESOPHAGOGASTRODUODENOSCOPY  07/21/09   schatzis ring s/p dilation;small HH/mild erosive reflux/patent pylorus  . ESOPHAGOGASTRODUODENOSCOPY  08/27/12   3 cm hiatal hernia/gastric polyp, bx fundic gland polyp  . TONSILLECTOMY    . TUBAL LIGATION      There were no vitals filed for this visit.      Subjective Assessment - 10/12/16 1727    Subjective Pt reports she is feeling good today, stated the spasms come and go.  Reports complaince iwth HEP daily.   Pertinent History [place], anxiety, depression, HTN, HLD   Patient Stated Goals improve pain for good    Currently in Pain? No/denies          [facility] Adult PT Treatment/Exercise - 10/12/16 0001      Neck Exercises: Theraband   Shoulder  Extension 15 reps;Red   Rows 10 reps;Red   Rows Limitations cueing for form     Neck Exercises: Seated   Neck Retraction 15 reps;3 secs   Other Seated Exercise thoracic extension self mob over chair, x3 reps each segment T8 to T2; 3D thoracic excursion 5reps each   Other Seated Exercise scap retraction x15 reps      Manual Therapy   Manual Therapy Soft tissue mobilization   Manual therapy comments separate rest of session   Soft tissue mobilization STM Lt upper trap, levator scap, scalenes     Neck Exercises: Stretches   Upper Trapezius Stretch 3 reps;30 seconds  Pt able to demonstrate appropriate form, cueing for hold tim   Corner Stretch 2 reps;30 seconds                  PT Short Term Goals - 09/26/16 1659      PT SHORT TERM GOAL #1   Title Pt will demo consistency and independence with her HEP to improve pain and postural control.    Time 2   Period Weeks   Status New     PT SHORT TERM GOAL #2   Title Pt will demo correct set up and use of lumbar roll to improve sitting posture at work.   Time 2   Period Weeks   Status New  PT Long Term Goals - 09/26/16 1700      PT LONG TERM GOAL #1   Title Pt will demo consistency and independence with her advanced HEP to prepare her for d/c from therapy.   Time 6   Period Weeks   Status New     PT LONG TERM GOAL #2   Title Pt will demo improved cervical ROM in all directions without increase in pain/tightness, to allow her to perform daily activity such as driving to work without difficulty.    Time 6   Period Weeks   Status New     PT LONG TERM GOAL #3   Title Pt will present with decreased tenderness with palpation and minimal palpable trigger points along her Lt upper trap to improve activity tolerance.   Time 6   Period Weeks   Status New     PT LONG TERM GOAL #4   Title Pt will report no more than 3/10 pain during the day to allow her to lift a bag of groceries with improved tolerance.    Time 6   Period Weeks   Status New               Plan - 10/12/16 1729    Clinical Impression Statement Reviewed HEP with ability to complete with min cueing to improve form and hold time with stretches for maximal benefits.  Session focus on improving postural impairements and manual technqiues to address muscle spasms and trigger points.  Added 3D thoracic excursion to improve spinal mobiltiy as well as posture strengthening therex wtih min cueing initially for form and technique.  Ended session with manual soft tissue mobilization techniques to reduce spams and trigger point Lt upper trap, noted tightness posterior scalene and leveator scapula that was reduced following manual.  No reoprts of pain through session.     Rehab Potential Good   PT Frequency 1x / week   PT Duration 6 weeks   PT Treatment/Interventions ADLs/Self Care Home Management;Moist Heat;Cryotherapy;Therapeutic exercise;Therapeutic activities;Neuromuscular re-education;Patient/family education;Manual techniques;Passive range of motion;Taping;Dry needling   PT Next Visit Plan review new HEP form and technique, trigger point release/STM along Lt upper trap, therex to address postural impairments   PT Home Exercise Plan upper trap stretch 3x30 sec, thoracic ext self mob over chair; 10/19 3D thoracic excursion and corner stretch      Patient will benefit from skilled therapeutic intervention in order to improve the following deficits and impairments:  Decreased activity tolerance, Impaired flexibility, Postural dysfunction, Pain, Increased muscle spasms  Visit Diagnosis: Other muscle spasm  Cervicalgia     Problem List Patient Active Problem List   Diagnosis Date Noted  . COPD GOLD II 01/21/2015  . Cigarette smoker 01/21/2015  . Nocturnal hypoxemia 01/21/2015  . Abnormal ultrasound of liver 09/04/2012  . Epigastric pain 08/05/2012  . Encounter for screening colonoscopy 08/05/2012  . SCHATZKI'S RING 07/14/2009   . GERD 07/14/2009  . CONSTIPATION, CHRONIC 07/14/2009  . CHEST PAIN, ATYPICAL 07/14/2009  . DYSPHAGIA 07/14/2009   Ihor Austin, [place]; [place]  Aldona Lento 10/12/2016, 6:14 PM  [place], [place], [address] Phone: [phone]   Fax:  [phone]  Name: Lorraine Wells MRN: [id number] Date of Birth: Jan 21, 1956

## 2016-10-12 NOTE — Patient Instructions (Signed)
Flexibility: Corner Stretch    Standing in corner with hands just above shoulder level and feet ____ inches from corner, lean forward until a comfortable stretch is felt across chest. Hold 30 seconds. Repeat 2-3 times per set. Do 1 session per day.  http://orth.exer.us/343   Copyright  VHI. All rights reserved.

## 2016-10-19 ENCOUNTER — Ambulatory Visit (HOSPITAL_COMMUNITY): Payer: 59

## 2016-10-19 DIAGNOSIS — M542 Cervicalgia: Secondary | ICD-10-CM

## 2016-10-19 DIAGNOSIS — M62838 Other muscle spasm: Secondary | ICD-10-CM

## 2016-10-19 NOTE — Therapy (Signed)
[facility] [address] [place], [place], [address] Phone: [phone]   Fax:  [phone]  Physical Therapy Treatment  Patient Details  Name: Lorraine Wells MRN: [id number] Date of Birth: 04-Sep-1956 Referring Provider: Joni Fears, MD  Encounter Date: 10/19/2016      PT End of Session - 10/19/16 1736    Visit Number 4   Number of Visits 7   Date for PT Re-Evaluation 10/18/16   Authorization Type UHC   Authorization Time Period 09/26/16 to 11/17/177   PT Start Time [id number]   PT Stop Time 1812   PT Time Calculation (min) 38 min      Past Medical History:  Diagnosis Date  . Anxiety   . Asthma   . Depression   . Fatty liver disease, nonalcoholic   . GERD (gastroesophageal reflux disease)   . Hyperlipidemia   . Hypertension   . Kidney stones   . Migraine     Past Surgical History:  Procedure Laterality Date  . COLONOSCOPY  07/08/02   minimal internal hemorrhoids/otherwise normal  . COLONOSCOPY  08/27/12   Hyperplastic polyp/left-sided diverticulosis. next colonoscopy September 2023  . CORONARY ANGIOPLASTY    . ESOPHAGOGASTRODUODENOSCOPY  07/21/09   schatzis ring s/p dilation;small HH/mild erosive reflux/patent pylorus  . ESOPHAGOGASTRODUODENOSCOPY  08/27/12   3 cm hiatal hernia/gastric polyp, bx fundic gland polyp  . TONSILLECTOMY    . TUBAL LIGATION      There were no vitals filed for this visit.      Subjective Assessment - 10/19/16 1733    Subjective Pt stated she is feeling good today, no reports of pain currently   Pertinent History [place], anxiety, depression, HTN, HLD   Patient Stated Goals improve pain for good    Currently in Pain? No/denies              [facility] Adult PT Treatment/Exercise - 10/19/16 0001      Neck Exercises: Theraband   Scapula Retraction 10 reps;Red   Scapula Retraction Limitations cueing for form   Shoulder Extension 15 reps   Rows 15 reps;Red     Neck Exercises: Standing    Neck Retraction 10 reps;5 secs   Upper Extremity Flexion with Stabilization Flexion;10 reps   UE Flexion with Stabilization Limitations infront of wall with chin tuck and UE flexin     Neck Exercises: Seated   Neck Retraction 15 reps;3 secs   Other Seated Exercise 3D thoracic excursion     Manual Therapy   Manual Therapy Soft tissue mobilization   Manual therapy comments separate rest of session   Soft tissue mobilization STM Lt upper trap, levator scap, scalenes   Myofascial Release TrP release Lt upper trap, levator scap      Neck Exercises: Stretches   Upper Trapezius Stretch 3 reps;30 seconds   Chest Stretch 2 reps;30 seconds                  PT Short Term Goals - 09/26/16 1659      PT SHORT TERM GOAL #1   Title Pt will demo consistency and independence with her HEP to improve pain and postural control.    Time 2   Period Weeks   Status New     PT SHORT TERM GOAL #2   Title Pt will demo correct set up and use of lumbar roll to improve sitting posture at work.   Time 2   Period Weeks   Status New  PT Long Term Goals - 09/26/16 1700      PT LONG TERM GOAL #1   Title Pt will demo consistency and independence with her advanced HEP to prepare her for d/c from therapy.   Time 6   Period Weeks   Status New     PT LONG TERM GOAL #2   Title Pt will demo improved cervical ROM in all directions without increase in pain/tightness, to allow her to perform daily activity such as driving to work without difficulty.    Time 6   Period Weeks   Status New     PT LONG TERM GOAL #3   Title Pt will present with decreased tenderness with palpation and minimal palpable trigger points along her Lt upper trap to improve activity tolerance.   Time 6   Period Weeks   Status New     PT LONG TERM GOAL #4   Title Pt will report no more than 3/10 pain during the day to allow her to lift a bag of groceries with improved tolerance.   Time 6   Period Weeks    Status New               Plan - 10/19/16 1754    Clinical Impression Statement Session focus on improving postural awareness and postural strenghtening.  Added theraband resistance with scapular retraction and began cervical retraction exercises infront of wall.  Min cueing to improve form with cervical retraction and to increase hold times with stretches for maximal benefits.  Ended session with manual soft tissue mobilization technqiues to reduce spams and trigger point Lt upper trap, pt reported relief following manual.     Rehab Potential Good   PT Frequency 1x / week   PT Duration 6 weeks   PT Treatment/Interventions ADLs/Self Care Home Management;Moist Heat;Cryotherapy;Therapeutic exercise;Therapeutic activities;Neuromuscular re-education;Patient/family education;Manual techniques;Passive range of motion;Taping;Dry needling   PT Next Visit Plan review new HEP form and technique (length of time with stretches and form wtih 3D thoracic excursion), trigger point release/STM along Lt upper trap, therex to address postural impairments   PT Home Exercise Plan upper trap stretch 3x30 sec, thoracic ext self mob over chair; 10/19 3D thoracic excursion and corner stretch      Patient will benefit from skilled therapeutic intervention in order to improve the following deficits and impairments:  Decreased activity tolerance, Impaired flexibility, Postural dysfunction, Pain, Increased muscle spasms  Visit Diagnosis: Other muscle spasm  Cervicalgia     Problem List Patient Active Problem List   Diagnosis Date Noted  . COPD GOLD II 01/21/2015  . Cigarette smoker 01/21/2015  . Nocturnal hypoxemia 01/21/2015  . Abnormal ultrasound of liver 09/04/2012  . Epigastric pain 08/05/2012  . Encounter for screening colonoscopy 08/05/2012  . 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 GERD 07/14/2009  . CONSTIPATION, CHRONIC 07/14/2009  . CHEST PAIN, ATYPICAL 07/14/2009  . DYSPHAGIA 07/14/2009   Lorraine Wells, LPTA; [place]  Aldona Lento 10/19/2016, 6:17 PM  [place], [place], [address] Phone: [phone]   Fax:  [phone]  Name: Lorraine Wells MRN: [id number] Date of Birth: 10-03-1956

## 2016-10-26 ENCOUNTER — Telehealth (HOSPITAL_COMMUNITY): Payer: Self-pay | Admitting: Family Medicine

## 2016-10-26 ENCOUNTER — Ambulatory Visit (HOSPITAL_COMMUNITY): Payer: 59 | Admitting: Physical Therapy

## 2016-10-26 ENCOUNTER — Telehealth (HOSPITAL_COMMUNITY): Payer: Self-pay | Admitting: Physical Therapy

## 2016-10-26 NOTE — Telephone Encounter (Signed)
Called pt to offer earlier appt today. LMOM and encouraged her to call if interested.  10:13 AM,10/26/16 Elly Modena PT, [facility] [phone]

## 2016-10-26 NOTE — Telephone Encounter (Signed)
10/26/16 pt cx today because she said she just wasn't feeling good today

## 2016-11-02 ENCOUNTER — Ambulatory Visit (HOSPITAL_COMMUNITY): Payer: 59 | Attending: Family Medicine | Admitting: Physical Therapy

## 2016-11-02 DIAGNOSIS — M62838 Other muscle spasm: Secondary | ICD-10-CM

## 2016-11-02 DIAGNOSIS — M542 Cervicalgia: Secondary | ICD-10-CM | POA: Insufficient documentation

## 2016-11-02 NOTE — Therapy (Addendum)
[facility] [address] [place], [place], [address] Phone: [phone]   Fax:  [phone]  Physical Therapy Treatment/Discharge  Patient Details  Name: Lorraine Wells MRN: [id number] Date of Birth: Nov 15, 1956 Referring Provider: Joni Fears, MD  Encounter Date: 11/02/2016      PT End of Session - 11/02/16 1725    Visit Number 5   Number of Visits 7   Date for PT Re-Evaluation 10/18/16   Authorization Type UHC   Authorization Time Period 09/26/16 to 11/17/177   PT Start Time 1715  due to availability in therapist schedule    PT Stop Time 1730   PT Time Calculation (min) 15 min   Activity Tolerance Patient tolerated treatment well   Behavior During Therapy [facility] for tasks assessed/performed      Past Medical History:  Diagnosis Date  . Anxiety   . Asthma   . Depression   . Fatty liver disease, nonalcoholic   . GERD (gastroesophageal reflux disease)   . Hyperlipidemia   . Hypertension   . Kidney stones   . Migraine     Past Surgical History:  Procedure Laterality Date  . COLONOSCOPY  07/08/02   minimal internal hemorrhoids/otherwise normal  . COLONOSCOPY  08/27/12   Hyperplastic polyp/left-sided diverticulosis. next colonoscopy September 2023  . CORONARY ANGIOPLASTY    . ESOPHAGOGASTRODUODENOSCOPY  07/21/09   schatzis ring s/p dilation;small HH/mild erosive reflux/patent pylorus  . ESOPHAGOGASTRODUODENOSCOPY  08/27/12   3 cm hiatal hernia/gastric polyp, bx fundic gland polyp  . TONSILLECTOMY    . TUBAL LIGATION      There were no vitals filed for this visit.      Subjective Assessment - 11/02/16 1718    Subjective Pt states that she has been doing great. No pain since her first visit. She feels she is ready to be discharged.   Pertinent History [place], anxiety, depression, HTN, HLD   How long can you sit comfortably? unlimited    How long can you stand comfortably? unlimited   How long can you walk comfortably?  unlimited    Diagnostic tests xray: arthritis    Patient Stated Goals improve pain for good    Currently in Pain? No/denies            [facility] PT Assessment - 11/02/16 0001      Assessment   Medical Diagnosis Lt shoulder/neck pain    Referring Provider Joni Fears, MD   Next MD Visit none    Prior Therapy none      Precautions   Precautions None     Balance Screen   Has the patient fallen in the past 6 months No   Has the patient had a decrease in activity level because of a fear of falling?  No   Is the patient reluctant to leave their home because of a fear of falling?  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     Cognition   Overall Cognitive Status Within Functional Limits for tasks assessed     Observation/Other Assessments   Focus on Therapeutic Outcomes (FOTO)  4% limitation     Sensation   Light Touch Appears Intact     AROM   Cervical Flexion WNL, pain free   Cervical Extension WNL, pain free   Cervical - Right Side Bend WNL, pain free   Cervical -  Left Side Bend WNL, pain free   Cervical - Right Rotation WNL, pain free   Cervical - Left Rotation WNL, pain free     Palpation   Palpation comment non tender with palpation                              PT Education - 11/02/16 1733    Education provided Yes   Education Details discussed progress and goals; encouraged continued HEP adherence several times/week and provided 2 week trial at [facility] for continued wellness.    Person(s) Educated Patient   Methods Explanation;Handout   Comprehension Verbalized understanding          PT Short Term Goals - 11/02/16 1726      PT SHORT TERM GOAL #1   Title Pt will demo consistency and independence with her HEP to improve pain and postural control.    Time 2   Period Weeks   Status Achieved     PT SHORT TERM GOAL #2   Title Pt will  demo correct set up and use of lumbar roll to improve sitting posture at work.   Time 2   Period Weeks   Status Achieved           PT Long Term Goals - 11/02/16 1726      PT LONG TERM GOAL #1   Title Pt will demo consistency and independence with her advanced HEP to prepare her for d/c from therapy.   Time 6   Period Weeks   Status Achieved     PT LONG TERM GOAL #2   Title Pt will demo improved cervical ROM in all directions without increase in pain/tightness, to allow her to perform daily activity such as driving to work without difficulty.    Time 6   Period Weeks   Status Achieved     PT LONG TERM GOAL #3   Title Pt will present with decreased tenderness with palpation and minimal palpable trigger points along her Lt upper trap to improve activity tolerance.   Time 6   Period Weeks   Status Achieved     PT LONG TERM GOAL #4   Title Pt will report no more than 3/10 pain during the day to allow her to lift a bag of groceries with improved tolerance.   Time 6   Period Weeks   Status Achieved               Plan - 11/02/16 1738    Clinical Impression Statement Pt has made great progress since beginning therapy several weeks ago. She presents without pain for several weeks now, reporting she is able to tolerate all activity during the day without any pain or muscle spasm. Her cervical AROM is full and pain free and she verbalized understanding of proper sitting posture with use of lumbar support at work. She has met all of her goals and being discharged from PT at this time being pleased with her progress and independence with HEP.   Rehab Potential Good   PT Frequency 1x / week   PT Duration 6 weeks   PT Treatment/Interventions ADLs/Self Care Home Management;Moist Heat;Cryotherapy;Therapeutic exercise;Therapeutic activities;Neuromuscular re-education;Patient/family education;Manual techniques;Passive range of motion;Taping;Dry needling   PT Next Visit Plan review new  HEP form and technique (length of time with stretches and form wtih 3D thoracic excursion), trigger point release/STM along Lt upper trap, therex to address postural impairments  PT Home Exercise Plan upper trap stretch 3x30 sec, thoracic ext self mob over chair; 10/19 3D thoracic excursion and corner stretch   Recommended Other Services none    Consulted and Agree with Plan of Care Patient      Patient will benefit from skilled therapeutic intervention in order to improve the following deficits and impairments:  Decreased activity tolerance, Impaired flexibility, Postural dysfunction, Pain, Increased muscle spasms  Visit Diagnosis: Other muscle spasm  Cervicalgia       G-Codes - 11/18/16 1730    Functional Assessment Tool Used FOTO: 4% limited in addition to clinical judgement based on assessment of ROM and activity   Functional Limitation Changing and maintaining body position   Changing and Maintaining Body Position Goal Status (K0881) At least 20 percent but less than 40 percent impaired, limited or restricted   Changing and Maintaining Body Position Discharge Status (J0315) 0 percent impaired, limited or restricted      Problem List Patient Active Problem List   Diagnosis Date Noted  . COPD GOLD II 01/21/2015  . Cigarette smoker 01/21/2015  . Nocturnal hypoxemia 01/21/2015  . Abnormal ultrasound of liver 09/04/2012  . Epigastric pain 08/05/2012  . Encounter for screening colonoscopy 08/05/2012  . 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 GERD 07/14/2009  . CONSTIPATION, CHRONIC 07/14/2009  . CHEST PAIN, ATYPICAL 07/14/2009  . DYSPHAGIA 07/14/2009   PHYSICAL THERAPY DISCHARGE SUMMARY  Visits from Start of Care: 5  Current functional level related to goals / functional outcomes: Pain free and full cervical ROM. All goals met and independence with HEP   Remaining deficits: None noted at this time    Education / Equipment: Information concerning [employer] and free 2  week trial Plan: Patient agrees to discharge.  Patient goals were met. Patient is being discharged due to meeting the stated rehab goals.  ?????         5:45 PM,11/18/16 Elly Modena PT, DPT [facility] [place] [address] [place], [place], [address] Phone: [phone]   Fax:  [phone]  Name: AMAREA MACDOWELL MRN: [id number] Date of Birth: Nov 14, 1956   *Addendum to close episode of care  5:47 PM,Nov 18, 2016 Elly Modena PT, [facility] [phone]

## 2016-11-09 ENCOUNTER — Encounter (HOSPITAL_COMMUNITY): Payer: 59 | Admitting: Physical Therapy

## 2016-12-26 DIAGNOSIS — H524 Presbyopia: Secondary | ICD-10-CM | POA: Diagnosis not present

## 2016-12-26 DIAGNOSIS — H5202 Hypermetropia, left eye: Secondary | ICD-10-CM | POA: Diagnosis not present

## 2016-12-26 DIAGNOSIS — H52223 Regular astigmatism, bilateral: Secondary | ICD-10-CM | POA: Diagnosis not present

## 2017-03-07 DIAGNOSIS — E119 Type 2 diabetes mellitus without complications: Secondary | ICD-10-CM | POA: Diagnosis not present

## 2017-03-07 DIAGNOSIS — E782 Mixed hyperlipidemia: Secondary | ICD-10-CM | POA: Diagnosis not present

## 2017-03-07 DIAGNOSIS — K219 Gastro-esophageal reflux disease without esophagitis: Secondary | ICD-10-CM | POA: Diagnosis not present

## 2017-05-11 DIAGNOSIS — E119 Type 2 diabetes mellitus without complications: Secondary | ICD-10-CM | POA: Diagnosis not present

## 2017-05-11 DIAGNOSIS — E782 Mixed hyperlipidemia: Secondary | ICD-10-CM | POA: Diagnosis not present

## 2017-05-11 DIAGNOSIS — K219 Gastro-esophageal reflux disease without esophagitis: Secondary | ICD-10-CM | POA: Diagnosis not present

## 2017-06-12 DIAGNOSIS — J45901 Unspecified asthma with (acute) exacerbation: Secondary | ICD-10-CM | POA: Diagnosis not present

## 2017-06-12 DIAGNOSIS — J069 Acute upper respiratory infection, unspecified: Secondary | ICD-10-CM | POA: Diagnosis not present

## 2017-06-12 DIAGNOSIS — J209 Acute bronchitis, unspecified: Secondary | ICD-10-CM | POA: Diagnosis not present

## 2018-04-09 DIAGNOSIS — Z1389 Encounter for screening for other disorder: Secondary | ICD-10-CM | POA: Diagnosis not present

## 2018-04-09 DIAGNOSIS — J449 Chronic obstructive pulmonary disease, unspecified: Secondary | ICD-10-CM | POA: Diagnosis not present

## 2018-04-09 DIAGNOSIS — Z23 Encounter for immunization: Secondary | ICD-10-CM | POA: Diagnosis not present

## 2018-04-09 DIAGNOSIS — Z0000 Encounter for general adult medical examination without abnormal findings: Secondary | ICD-10-CM | POA: Diagnosis not present

## 2018-04-09 DIAGNOSIS — I10 Essential (primary) hypertension: Secondary | ICD-10-CM | POA: Diagnosis not present

## 2018-05-09 DIAGNOSIS — J069 Acute upper respiratory infection, unspecified: Secondary | ICD-10-CM | POA: Diagnosis not present

## 2018-08-05 DIAGNOSIS — H35341 Macular cyst, hole, or pseudohole, right eye: Secondary | ICD-10-CM | POA: Diagnosis not present

## 2018-08-06 DIAGNOSIS — H35341 Macular cyst, hole, or pseudohole, right eye: Secondary | ICD-10-CM | POA: Diagnosis not present

## 2018-08-06 DIAGNOSIS — Z01818 Encounter for other preprocedural examination: Secondary | ICD-10-CM | POA: Diagnosis not present

## 2018-09-03 DIAGNOSIS — H35341 Macular cyst, hole, or pseudohole, right eye: Secondary | ICD-10-CM | POA: Diagnosis not present

## 2018-09-03 DIAGNOSIS — H35371 Puckering of macula, right eye: Secondary | ICD-10-CM | POA: Diagnosis not present

## 2018-09-25 DIAGNOSIS — Z09 Encounter for follow-up examination after completed treatment for conditions other than malignant neoplasm: Secondary | ICD-10-CM | POA: Diagnosis not present

## 2018-09-25 DIAGNOSIS — H35341 Macular cyst, hole, or pseudohole, right eye: Secondary | ICD-10-CM | POA: Diagnosis not present

## 2018-10-29 DIAGNOSIS — H35341 Macular cyst, hole, or pseudohole, right eye: Secondary | ICD-10-CM | POA: Diagnosis not present

## 2018-11-05 DIAGNOSIS — J22 Unspecified acute lower respiratory infection: Secondary | ICD-10-CM | POA: Diagnosis not present

## 2018-11-05 DIAGNOSIS — E663 Overweight: Secondary | ICD-10-CM | POA: Diagnosis not present

## 2018-11-05 DIAGNOSIS — H6993 Unspecified Eustachian tube disorder, bilateral: Secondary | ICD-10-CM | POA: Diagnosis not present

## 2018-11-29 DIAGNOSIS — H35371 Puckering of macula, right eye: Secondary | ICD-10-CM | POA: Diagnosis not present

## 2018-11-29 DIAGNOSIS — H2511 Age-related nuclear cataract, right eye: Secondary | ICD-10-CM | POA: Diagnosis not present

## 2018-11-29 DIAGNOSIS — H2512 Age-related nuclear cataract, left eye: Secondary | ICD-10-CM | POA: Diagnosis not present

## 2018-12-12 DIAGNOSIS — H2511 Age-related nuclear cataract, right eye: Secondary | ICD-10-CM | POA: Diagnosis not present

## 2018-12-13 DIAGNOSIS — H25811 Combined forms of age-related cataract, right eye: Secondary | ICD-10-CM | POA: Diagnosis not present

## 2019-01-01 DIAGNOSIS — H35341 Macular cyst, hole, or pseudohole, right eye: Secondary | ICD-10-CM | POA: Diagnosis not present

## 2019-03-17 DIAGNOSIS — J301 Allergic rhinitis due to pollen: Secondary | ICD-10-CM | POA: Diagnosis not present

## 2019-03-17 DIAGNOSIS — E663 Overweight: Secondary | ICD-10-CM | POA: Diagnosis not present

## 2019-03-17 DIAGNOSIS — Z6825 Body mass index (BMI) 25.0-25.9, adult: Secondary | ICD-10-CM | POA: Diagnosis not present

## 2019-11-13 ENCOUNTER — Other Ambulatory Visit (HOSPITAL_COMMUNITY): Payer: Self-pay | Admitting: Family Medicine

## 2019-11-13 DIAGNOSIS — E2839 Other primary ovarian failure: Secondary | ICD-10-CM

## 2019-11-13 DIAGNOSIS — Z1231 Encounter for screening mammogram for malignant neoplasm of breast: Secondary | ICD-10-CM

## 2020-05-11 ENCOUNTER — Other Ambulatory Visit (HOSPITAL_COMMUNITY): Payer: Self-pay | Admitting: Family Medicine

## 2020-05-11 DIAGNOSIS — Z1231 Encounter for screening mammogram for malignant neoplasm of breast: Secondary | ICD-10-CM

## 2020-05-11 DIAGNOSIS — E2839 Other primary ovarian failure: Secondary | ICD-10-CM

## 2020-06-08 ENCOUNTER — Emergency Department (HOSPITAL_COMMUNITY): Payer: Medicare Other

## 2020-06-08 ENCOUNTER — Ambulatory Visit
Admission: EM | Admit: 2020-06-08 | Discharge: 2020-06-08 | Disposition: A | Discharge status: Home / Self Care | Payer: Medicare Other | Source: Home / Self Care

## 2020-06-08 ENCOUNTER — Other Ambulatory Visit: Payer: Self-pay

## 2020-06-08 ENCOUNTER — Encounter (HOSPITAL_COMMUNITY): Payer: Self-pay | Admitting: Emergency Medicine

## 2020-06-08 ENCOUNTER — Emergency Department (HOSPITAL_COMMUNITY)
Admission: EM | Admit: 2020-06-08 | Discharge: 2020-06-08 | Disposition: A | Discharge status: Home / Self Care | Payer: Medicare Other | Attending: Emergency Medicine | Admitting: Emergency Medicine

## 2020-06-08 DIAGNOSIS — W01198A Fall on same level from slipping, tripping and stumbling with subsequent striking against other object, initial encounter: Secondary | ICD-10-CM | POA: Insufficient documentation

## 2020-06-08 DIAGNOSIS — S0502XA Injury of conjunctiva and corneal abrasion without foreign body, left eye, initial encounter: Secondary | ICD-10-CM | POA: Diagnosis not present

## 2020-06-08 DIAGNOSIS — S0592XA Unspecified injury of left eye and orbit, initial encounter: Secondary | ICD-10-CM | POA: Diagnosis present

## 2020-06-08 DIAGNOSIS — Y92002 Bathroom of unspecified non-institutional (private) residence single-family (private) house as the place of occurrence of the external cause: Secondary | ICD-10-CM | POA: Insufficient documentation

## 2020-06-08 DIAGNOSIS — J449 Chronic obstructive pulmonary disease, unspecified: Secondary | ICD-10-CM | POA: Diagnosis not present

## 2020-06-08 DIAGNOSIS — I10 Essential (primary) hypertension: Secondary | ICD-10-CM | POA: Insufficient documentation

## 2020-06-08 DIAGNOSIS — F17210 Nicotine dependence, cigarettes, uncomplicated: Secondary | ICD-10-CM | POA: Diagnosis not present

## 2020-06-08 DIAGNOSIS — Y9389 Activity, other specified: Secondary | ICD-10-CM | POA: Diagnosis not present

## 2020-06-08 DIAGNOSIS — Y999 Unspecified external cause status: Secondary | ICD-10-CM | POA: Diagnosis not present

## 2020-06-08 DIAGNOSIS — Z7982 Long term (current) use of aspirin: Secondary | ICD-10-CM | POA: Insufficient documentation

## 2020-06-08 DIAGNOSIS — S0232XA Fracture of orbital floor, left side, initial encounter for closed fracture: Secondary | ICD-10-CM | POA: Insufficient documentation

## 2020-06-08 DIAGNOSIS — Z792 Long term (current) use of antibiotics: Secondary | ICD-10-CM | POA: Diagnosis not present

## 2020-06-08 DIAGNOSIS — J45909 Unspecified asthma, uncomplicated: Secondary | ICD-10-CM | POA: Insufficient documentation

## 2020-06-08 DIAGNOSIS — S0230XA Fracture of orbital floor, unspecified side, initial encounter for closed fracture: Secondary | ICD-10-CM

## 2020-06-08 MED ORDER — FLUORESCEIN SODIUM 1 MG OP STRP
1.0000 | ORAL_STRIP | Freq: Once | OPHTHALMIC | Status: AC
  Administered 2020-06-08: 1 via OPHTHALMIC
  Filled 2020-06-08: qty 1

## 2020-06-08 MED ORDER — ERYTHROMYCIN 5 MG/GM OP OINT
TOPICAL_OINTMENT | OPHTHALMIC | 0 refills | Status: DC
Start: 2020-06-08 — End: 2020-07-07

## 2020-06-08 MED ORDER — ERYTHROMYCIN 5 MG/GM OP OINT
TOPICAL_OINTMENT | Freq: Once | OPHTHALMIC | Status: AC
  Filled 2020-06-08: qty 3.5

## 2020-06-08 MED ORDER — TETRACAINE HCL 0.5 % OP SOLN
2.0000 [drp] | Freq: Once | OPHTHALMIC | Status: AC
  Administered 2020-06-08: 2 [drp] via OPHTHALMIC
  Filled 2020-06-08: qty 4

## 2020-06-08 NOTE — ED Notes (Signed)
Patient is being discharged from the Urgent Care and sent to the Emergency Department via pov . Per K. Avegno, patient is in need of higher level of care due to eye injury. Patient is aware and verbalizes understanding of plan of care. There were no vitals filed for this visit.

## 2020-06-08 NOTE — ED Triage Notes (Signed)
Pt fell in the bathtub and hit her LT eye and head on the faucet. Denies LOC and blood thinners. Obvious bruising and swelling to left eye.

## 2020-06-08 NOTE — Discharge Instructions (Addendum)
You are seen in the ER after your fall.  Imaging is showing that you have orbital floor fracture and corneal abrasion.  Please follow-up with ophthalmologist in 2 days.  Call them today for an appointment later this week.

## 2020-06-08 NOTE — ED Provider Notes (Signed)
[facility] EMERGENCY DEPARTMENT Provider Note   CSN: [id number] Arrival date & time: 06/08/20  1129     History Chief Complaint  Patient presents with  . Fall    Lorraine Wells is a 64 y.o. female.  HPI    Patient comes in a chief complaint of fall. Pt had a fall y'day when she slipped from the shower. She is having headache and pain around her L eye, which is shut swollen. She also has slight blurry vision from her L eye. No eye pain or photophobia. Pt is not on blood thinners.  Past Medical History:  Diagnosis Date  . Anxiety   . Asthma   . Depression   . Fatty liver disease, nonalcoholic   . GERD (gastroesophageal reflux disease)   . Hyperlipidemia   . Hypertension   . Kidney stones   . Migraine     Patient Active Problem List   Diagnosis Date Noted  . COPD GOLD II 01/21/2015  . Cigarette smoker 01/21/2015  . Nocturnal hypoxemia 01/21/2015  . Abnormal ultrasound of liver 09/04/2012  . Epigastric pain 08/05/2012  . Encounter for screening colonoscopy 08/05/2012  . 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 GERD 07/14/2009  . CONSTIPATION, CHRONIC 07/14/2009  . CHEST PAIN, ATYPICAL 07/14/2009  . DYSPHAGIA 07/14/2009    Past Surgical History:  Procedure Laterality Date  . COLONOSCOPY  07/08/02   minimal internal hemorrhoids/otherwise normal  . COLONOSCOPY  08/27/12   Hyperplastic polyp/left-sided diverticulosis. next colonoscopy September 2023  . CORONARY ANGIOPLASTY    . ESOPHAGOGASTRODUODENOSCOPY  07/21/09   schatzis ring s/p dilation;small HH/mild erosive reflux/patent pylorus  . ESOPHAGOGASTRODUODENOSCOPY  08/27/12   3 cm hiatal hernia/gastric polyp, bx fundic gland polyp  . EYE SURGERY    . TONSILLECTOMY    . TUBAL LIGATION       OB History   No obstetric history on file.     Family History  Problem Relation Age of Onset  . Heart attack Father   . Colon cancer Paternal Aunt 66  . Liver disease Cousin        fatty liver,maternal  . Kidney cancer  Cousin        paternal  . Inflammatory bowel disease Neg Hx     Social History   Tobacco Use  . Smoking status: Current Some Day Smoker    Packs/day: 0.30    Years: 35.00    Pack years: 10.50    Types: Cigarettes  . Smokeless tobacco: Never Used  Substance Use Topics  . Alcohol use: Yes    Alcohol/week: 0.0 standard drinks    Comment: occas.  . Drug use: No    Home Medications Prior to Admission medications   Medication Sig Start Date End Date Taking? Authorizing Provider  acetaminophen (TYLENOL) 500 MG tablet Take 500 mg by mouth every 6 (six) hours as needed for pain.    [provider]  amLODipine (NORVASC) 5 MG tablet Take 10 mg by mouth daily.  07/06/12   [provider]  aspirin 81 MG tablet Take 81 mg by mouth daily.    [provider]  clonazePAM (KLONOPIN) 0.5 MG tablet Take 0.5 mg by mouth 2 (two) times daily as needed for anxiety. Sleep 06/18/12   [provider]  doxycycline (VIBRAMYCIN) 100 MG capsule Take 1 capsule (100 mg total) by mouth 2 (two) times daily. 03/18/15   Gregor Hams, MD  erythromycin ophthalmic ointment Place a 1/2 inch ribbon of ointment into the  lower eyelid. 06/08/20   Varney Biles, MD  esomeprazole (NEXIUM) 40 MG capsule Take 40 mg by mouth daily at 12 noon.    [provider]  ipratropium-albuterol (DUONEB) 0.5-2.5 (3) MG/3ML SOLN Take 3 mLs by nebulization every 6 (six) hours as needed.    [provider]  montelukast (SINGULAIR) 10 MG tablet Take 10 mg by mouth at bedtime.  07/18/12   [provider]  pramipexole (MIRAPEX) 0.125 MG tablet Take 1 pill each night for 1 week, the 2 pills each night for 1 week, then 3 pills each night thereafter. Patient taking differently: Take 0.125 mg by mouth at bedtime.  01/08/15   Star Age, MD  PROAIR HFA 108 (90 BASE) MCG/ACT inhaler Inhale 1 puff into the lungs every 4 (four) hours as needed. Shortness of Breath 08/02/12   [provider]  SYMBICORT 160-4.5 MCG/ACT inhaler INHALE 2 PUFFS INTO THE LUNGS FIRST [facility] MORNING THEN ANOTHER 2PUFFS ABOUT 12 HOURS LATER. 02/08/16   Tanda Rockers, MD    Allergies    Seasonal ic [cholestatin]  Review of Systems   Review of Systems  Constitutional: Positive for activity change.  Eyes: Positive for redness and visual disturbance. Negative for photophobia and pain.  Respiratory: Negative for shortness of breath.   Cardiovascular: Negative for chest pain.  Musculoskeletal: Negative for back pain.  Neurological: Positive for headaches. Negative for dizziness, weakness and numbness.  Hematological: Does not bruise/bleed easily.  All other systems reviewed and are negative.   Physical Exam Updated Vital Signs BP (!) 177/89   Pulse 78   Temp 97.9 F (36.6 C) (Oral)   Resp 20   Ht 5\' 2"  (1.575 m)   Wt 63.5 kg   SpO2 97%   BMI 25.61 kg/m   Physical Exam Vitals and nursing note reviewed.  Constitutional:      Appearance: She is well-developed.  HENT:     Head: Normocephalic and atraumatic.  Eyes:     General:        Left eye: Discharge present.    Extraocular Movements: Extraocular movements intact.     Conjunctiva/sclera: Conjunctivae normal.     Pupils: Pupils are equal, round, and reactive to light.     Comments: Chemosis of L conjunctiva PERRL EOMI, no photophobia, visual fields are intact Fluro uptake at 12'0 clock noted under woods lamp  Eye pressures - 16  Cardiovascular:     Rate and Rhythm: Normal rate.  Pulmonary:     Effort: Pulmonary effort is normal.  Abdominal:     General: Bowel sounds are normal.  Musculoskeletal:        General: No tenderness or deformity.     Cervical back: Normal range of motion and neck supple.     Comments: Head to toe evaluation shows no hematoma, bleeding of the scalp, no facial abrasions, no spine step offs, crepitus of the chest or neck, no tenderness to palpation of the bilateral upper and lower extremities, no  gross deformities, no chest tenderness, no pelvic pain.   Skin:    General: Skin is warm and dry.  Neurological:     Mental Status: She is alert and oriented to person, place, and time.     ED Results / Procedures / Treatments   Labs (all labs ordered are listed, but only abnormal results are displayed) Labs Reviewed - No data to display  EKG None  Radiology CT Head Wo Contrast  Result Date: 06/08/2020 CLINICAL DATA:  Status post fall in the bathtub with a blow to the left eye today. Initial encounter. EXAM: CT HEAD WITHOUT CONTRAST CT MAXILLOFACIAL WITHOUT CONTRAST TECHNIQUE: Multidetector CT imaging of the head and maxillofacial structures were performed using the standard protocol without intravenous contrast. Multiplanar CT image reconstructions of the maxillofacial structures were also generated. COMPARISON:  Head CT 09/17/2009 FINDINGS: CT HEAD FINDINGS Brain: No evidence of acute infarction, hemorrhage, hydrocephalus, extra-axial collection or mass lesion/mass effect. Mild chronic microvascular ischemic change noted. Vascular: Atherosclerosis. Skull: Intact.  No focal lesion. Other: None. CT MAXILLOFACIAL FINDINGS Osseous: The patient has an acute fracture of the inferior wall of the left orbit. There is inferior displacement at the anterior, lateral margin of the fracture of up to 0.4 cm. There is also mildly impacted fracture of the anterior, superior aspect of the medial wall of the left orbit. No other fracture is identified. The TMJs are located with some degenerative change present which is worse on the left. Orbits: The globes are intact and lenses are located. No extraocular muscle entrapment is identified. Sinuses: There is a small amount of hemorrhage in the left maxillary sinus. Soft tissues: Soft tissue contusion about the left eye is noted. IMPRESSION: Acute fractures of the inferior and medial walls of the left orbit as described. Soft tissue contusion about the left eye also  noted. No acute intracranial abnormality. Mild chronic microvascular ischemic change. Atherosclerosis. Electronically Signed   By: Inge Rise M.D.   On: 06/08/2020 13:39   CT Maxillofacial WO CM  Result Date: 06/08/2020 CLINICAL DATA:  Status post fall in the bathtub with a blow to the left eye today. Initial encounter. EXAM: CT HEAD WITHOUT CONTRAST CT MAXILLOFACIAL WITHOUT CONTRAST TECHNIQUE: Multidetector CT imaging of the head and maxillofacial structures were performed using the standard protocol without intravenous contrast. Multiplanar CT image reconstructions of the maxillofacial structures were also generated. COMPARISON:  Head CT 09/17/2009 FINDINGS: CT HEAD FINDINGS Brain: No evidence of acute infarction, hemorrhage, hydrocephalus, extra-axial collection or mass lesion/mass effect. Mild chronic microvascular ischemic change noted. Vascular: Atherosclerosis. Skull: Intact.  No focal lesion. Other: None. CT MAXILLOFACIAL FINDINGS Osseous: The patient has an acute fracture of the inferior wall of the left orbit. There is inferior displacement at the anterior, lateral margin of the fracture of up to 0.4 cm. There is also mildly impacted fracture of the anterior, superior aspect of the medial wall of the left orbit. No other fracture is identified. The TMJs are located with some degenerative change present which is worse on the left. Orbits: The globes are intact and lenses are located. No extraocular muscle entrapment is identified. Sinuses: There is a small amount of hemorrhage in the left maxillary sinus. Soft tissues: Soft tissue contusion about the left eye is noted. IMPRESSION: Acute fractures of the inferior and medial walls of the left orbit as described. Soft tissue contusion about the left eye also noted. No acute intracranial abnormality. Mild chronic microvascular ischemic change. Atherosclerosis. Electronically Signed   By: Inge Rise M.D.   On: 06/08/2020 13:39     Procedures Procedures (including critical care time)  Medications Ordered in ED Medications  fluorescein ophthalmic strip 1 strip (1 strip Left Eye Given 06/08/20 1308)  tetracaine (PONTOCAINE) 0.5 % ophthalmic solution 2 drop (2 drops Left Eye Given 06/08/20 1308)  erythromycin ophthalmic ointment ( Left Eye Given 06/08/20 1546)    ED Course  I have reviewed the triage vital signs and the nursing notes.  Pertinent labs &  imaging results that were available during my care of the patient were reviewed by me and considered in my medical decision making (see chart for details).    MDM Rules/Calculators/A&P                          PT comes in with cc of fall. Noted to have L sided periorbital ecchymoses. PERRL, EOMI, eye pressures are normal. There is increased blurry vision and pt noted to have corneal abrasion. CT scan shows orbital floor fx - no entrapment.  Optho f/u recommended. Topical antibiotics started.  Final Clinical Impression(s) / ED Diagnoses Final diagnoses:  Abrasion of left cornea, initial encounter  Orbital floor (blow-out) closed fracture [facility])    Rx / DC Orders ED Discharge Orders         Ordered    erythromycin ophthalmic ointment     Discontinue  Reprint     06/08/20 1545           Varney Biles, MD 06/09/20 0945

## 2020-06-08 NOTE — ED Triage Notes (Addendum)
Pt fell in the bathtub and hit her LT eye and head on the faucet

## 2020-07-01 ENCOUNTER — Other Ambulatory Visit: Payer: Self-pay

## 2020-07-01 ENCOUNTER — Ambulatory Visit (HOSPITAL_COMMUNITY)
Admission: RE | Admit: 2020-07-01 | Discharge: 2020-07-01 | Disposition: A | Discharge status: Home / Self Care | Payer: Medicare Other | Source: Ambulatory Visit | Attending: Family Medicine | Admitting: Family Medicine

## 2020-07-01 DIAGNOSIS — E2839 Other primary ovarian failure: Secondary | ICD-10-CM | POA: Diagnosis not present

## 2020-07-01 DIAGNOSIS — Z1231 Encounter for screening mammogram for malignant neoplasm of breast: Secondary | ICD-10-CM | POA: Insufficient documentation

## 2020-07-07 ENCOUNTER — Ambulatory Visit
Admission: EM | Admit: 2020-07-07 | Discharge: 2020-07-07 | Disposition: A | Discharge status: Home / Self Care | Payer: Medicare Other | Attending: Emergency Medicine | Admitting: Emergency Medicine

## 2020-07-07 ENCOUNTER — Other Ambulatory Visit: Payer: Self-pay

## 2020-07-07 DIAGNOSIS — R109 Unspecified abdominal pain: Secondary | ICD-10-CM | POA: Insufficient documentation

## 2020-07-07 DIAGNOSIS — R350 Frequency of micturition: Secondary | ICD-10-CM | POA: Diagnosis present

## 2020-07-07 LAB — POCT URINALYSIS DIP (MANUAL ENTRY)
Bilirubin, UA: NEGATIVE
Glucose, UA: NEGATIVE mg/dL
Ketones, POC UA: NEGATIVE mg/dL
Nitrite, UA: POSITIVE — AB
Protein Ur, POC: 30 mg/dL — AB
Spec Grav, UA: 1.02 (ref 1.010–1.025)
Urobilinogen, UA: 0.2 E.U./dL
pH, UA: 7.5 (ref 5.0–8.0)

## 2020-07-07 MED ORDER — CEPHALEXIN 500 MG PO CAPS: 500.0000 mg | ORAL_CAPSULE | Freq: Two times a day (BID) | ORAL | 0 refills | Status: AC

## 2020-07-07 NOTE — Discharge Instructions (Signed)
Urine concerning for UTI Urine culture sent.  We will call you with abnormal results.   Push fluids and get plenty of rest.   Take antibiotic as directed and to completion Follow up with PCP if symptoms persists Return here or go to ER if you have any new or worsening symptoms such as fever, worsening abdominal pain, nausea/vomiting, flank pain, etc..Marland Kitchen

## 2020-07-07 NOTE — ED Triage Notes (Signed)
Pt presents with complaints of right sided flank pain x 2 days. Reports she started having urinary frequency last night. Denies dysuria or abdominal pain. Denies taking any medications at home.

## 2020-07-07 NOTE — ED Provider Notes (Signed)
[facility]   CC: Flank pain and urinary frequency  SUBJECTIVE:  Lorraine Wells is a 64 y.o. female who complains of RT flank pain x 2 days and urinary frequency x 1 day.  Patient denies a precipitating event, recent sexual encounter, excessive caffeine intake.  Localizes the pain to the RT flank.  Pain is intermittent and describes it as throbbing.  Has NOT tried OTC medications.  Denies aggravating factors.  Denies similar symptoms in the past.  Denies fever, chills, nausea, vomiting, abdominal pain, abnormal vaginal discharge or bleeding, hematuria.    LMP: No LMP recorded. Patient is postmenopausal.  ROS: As in HPI.  All other pertinent ROS negative.     Past Medical History:  Diagnosis Date  . Anxiety   . Asthma   . Depression   . Fatty liver disease, nonalcoholic   . GERD (gastroesophageal reflux disease)   . Hyperlipidemia   . Hypertension   . Kidney stones   . Migraine    Past Surgical History:  Procedure Laterality Date  . COLONOSCOPY  07/08/02   minimal internal hemorrhoids/otherwise normal  . COLONOSCOPY  08/27/12   Hyperplastic polyp/left-sided diverticulosis. next colonoscopy September 2023  . CORONARY ANGIOPLASTY    . ESOPHAGOGASTRODUODENOSCOPY  07/21/09   schatzis ring s/p dilation;small HH/mild erosive reflux/patent pylorus  . ESOPHAGOGASTRODUODENOSCOPY  08/27/12   3 cm hiatal hernia/gastric polyp, bx fundic gland polyp  . EYE SURGERY    . TONSILLECTOMY    . TUBAL LIGATION     Allergies  Allergen Reactions  . Seasonal Ic [Cholestatin]     Itchy eyes , runny nose   No current facility-administered medications on file prior to encounter.   Current Outpatient Medications on File Prior to Encounter  Medication Sig Dispense Refill  . acetaminophen (TYLENOL) 500 MG tablet Take 500 mg by mouth every 6 (six) hours as needed for pain.    Marland Kitchen amLODipine (NORVASC) 5 MG tablet Take 10 mg by mouth daily.     Marland Kitchen aspirin 81 MG tablet Take 81 mg by mouth  daily.    . clonazePAM (KLONOPIN) 0.5 MG tablet Take 0.5 mg by mouth 2 (two) times daily as needed for anxiety. Sleep    . ipratropium-albuterol (DUONEB) 0.5-2.5 (3) MG/3ML SOLN Take 3 mLs by nebulization every 6 (six) hours as needed.    . montelukast (SINGULAIR) 10 MG tablet Take 10 mg by mouth at bedtime.     . pantoprazole (PROTONIX) 20 MG tablet Take 20 mg by mouth daily.    Marland Kitchen PROAIR HFA 108 (90 BASE) MCG/ACT inhaler Inhale 1 puff into the lungs every 4 (four) hours as needed. Shortness of Breath    . SYMBICORT 160-4.5 MCG/ACT inhaler INHALE 2 PUFFS INTO THE LUNGS FIRST THING EACH MORNING THEN ANOTHER 2PUFFS ABOUT 12 HOURS LATER. 10.2 g 11  . [DISCONTINUED] pramipexole (MIRAPEX) 0.125 MG tablet Take 1 pill each night for 1 week, the 2 pills each night for 1 week, then 3 pills each night thereafter. (Patient taking differently: Take 0.125 mg by mouth at bedtime. ) 90 tablet 5   Social History   Socioeconomic History  . Marital status: Divorced    Spouse name: Not on file  . Number of children: 1  . Years of education: 75  . Highest education level: Not on file  Occupational History    Employer: [employer]  Tobacco Use  . Smoking status: Current Some Day Smoker    Packs/day: 0.30  Years: 35.00    Pack years: 10.50    Types: Cigarettes  . Smokeless tobacco: Never Used  Substance and Sexual Activity  . Alcohol use: Yes    Alcohol/week: 0.0 standard drinks    Comment: occas.  . Drug use: No  . Sexual activity: Not on file  Other Topics Concern  . Not on file  Social History Narrative   Consumes 1 cup of caffeine daily   Social Determinants of Health   Financial Resource Strain:   . Difficulty of Paying Living Expenses:   Food Insecurity:   . Worried About Charity fundraiser in the Last Year:   . Arboriculturist in the Last Year:   Transportation Needs:   . Film/video editor (Medical):   Marland Kitchen Lack of Transportation (Non-Medical):   Physical Activity:   . Days  of Exercise per Week:   . Minutes of Exercise per Session:   Stress:   . Feeling of Stress :   Social Connections:   . Frequency of Communication with Friends and Family:   . Frequency of Social Gatherings with Friends and Family:   . Attends Religious Services:   . Active Member of Clubs or Organizations:   . Attends Archivist Meetings:   Marland Kitchen Marital Status:   Intimate Partner Violence:   . Fear of Current or Ex-Partner:   . Emotionally Abused:   Marland Kitchen Physically Abused:   . Sexually Abused:    Family History  Problem Relation Age of Onset  . Healthy Mother   . Heart attack Father   . Colon cancer Paternal Aunt 66  . Liver disease Cousin        fatty liver,maternal  . Kidney cancer Cousin        paternal  . Inflammatory bowel disease Neg Hx     OBJECTIVE:  Vitals:   07/07/20 1040  BP: (!) 166/94  Pulse: 78  Resp: 17  Temp: 98.4 F (36.9 C)  TempSrc: Tympanic  SpO2: 95%   General appearance: Alert in no acute distress HEENT: NCAT.  Oropharynx clear.  Lungs: clear to auscultation bilaterally without adventitious breath sounds Heart: regular rate and rhythm.   Abdomen: soft; non-distended; no tenderness; bowel sounds present; no guarding Back: no CVA tenderness Extremities: no edema; symmetrical with no gross deformities Skin: warm and dry Neurologic: Ambulates from chair to exam table without difficulty Psychological: alert and cooperative; normal mood and affect  Labs Reviewed  POCT URINALYSIS DIP (MANUAL ENTRY) - Abnormal; Notable for the following components:      Result Value   Blood, UA moderate (*)    Protein Ur, POC =30 (*)    Nitrite, UA Positive (*)    Leukocytes, UA Trace (*)    All other components within normal limits  URINE CULTURE    ASSESSMENT & PLAN:  1. Urinary frequency   2. Flank pain     Meds ordered this encounter  Medications  . cephALEXin (KEFLEX) 500 MG capsule    Sig: Take 1 capsule (500 mg total) by mouth 2 (two)  times daily for 10 days.    Dispense:  20 capsule    Refill:  0    Order Specific Question:   Supervising Provider    Answer:   Raylene Everts [[id number]]   Urine concerning for UTI Urine culture sent.  We will call you with abnormal results.   Push fluids and get plenty of rest.   Take antibiotic as  directed and to completion Follow up with PCP if symptoms persists Return here or go to ER if you have any new or worsening symptoms such as fever, worsening abdominal pain, nausea/vomiting, flank pain, etc...  Outlined signs and symptoms indicating need for more acute intervention. Patient verbalized understanding. After Visit Summary given.     Lestine Box, PA-C 07/07/20 1120

## 2020-07-10 LAB — URINE CULTURE: Culture: >=100000 — AB

## 2022-01-20 NOTE — Progress Notes (Signed)
CARDIOLOGY CONSULT NOTE       Patient ID: Lorraine Wells MRN: [id number] DOB/AGE: January 06, 1956 66 y.o.  Admit date: (Not on file) Referring Physician: golding Primary Physician: Sharilyn Sites, MD Primary Cardiologist: New Reason for Consultation: Chest Pain  Active Problems:   * No active hospital problems. *   HPI:  67 y.o. referred by Dr Hilma Favors for chest pain. History of anxiety,asthma, depression and GERD CRF;s family history , HTN and HLD Seen by primary in office 01/19/22 complained of chest pain. Sudden onset over 4 days Moderate intensity and dull Substernal and radiating to arms Initially noted walking in [employer]. Mild diaphoresis no dyspnea or palpitations Started on ASA and given SL: nitro Referred for further w/u    Works at [employer] and [employer] smoker 35 packyears < 1/2 ppd Significant other smokes She shops, and looks after her grand kids some but is sedentary   Has not needed nitro since seeing primary Pain in somewhat atypical in not being exertional always and sharp  ROS All other systems reviewed and negative except as noted above  Past Medical History:  Diagnosis Date   Anxiety    Asthma    Chest pain    Depression    Fatty liver disease, nonalcoholic    GERD (gastroesophageal reflux disease)    Hyperlipidemia    Hypertension    Kidney stones    Migraine     Family History  Problem Relation Age of Onset   Healthy Mother    Heart attack Father    Colon cancer Paternal Aunt 55   Liver disease Cousin        fatty liver,maternal   Kidney cancer Cousin        paternal   Inflammatory bowel disease Neg Hx     Social History   Socioeconomic History   Marital status: Divorced    Spouse name: Not on file   Number of children: 1   Years of education: 12   Highest education level: Not on file  Occupational History    Employer: [employer]  Tobacco Use   Smoking status: Some Days    Packs/day: 0.30    Years:  35.00    Pack years: 10.50    Types: Cigarettes   Smokeless tobacco: Never  Substance and Sexual Activity   Alcohol use: Yes    Alcohol/week: 0.0 standard drinks    Comment: occas.   Drug use: No   Sexual activity: Not on file  Other Topics Concern   Not on file  Social History Narrative   Consumes 1 cup of caffeine daily   Social Determinants of Health   Financial Resource Strain: Not on file  Food Insecurity: Not on file  Transportation Needs: Not on file  Physical Activity: Not on file  Stress: Not on file  Social Connections: Not on file  Intimate Partner Violence: Not on file    Past Surgical History:  Procedure Laterality Date   COLONOSCOPY  07/08/02   minimal internal hemorrhoids/otherwise normal   COLONOSCOPY  08/27/12   Hyperplastic polyp/left-sided diverticulosis. next colonoscopy September 2023   CORONARY ANGIOPLASTY     ESOPHAGOGASTRODUODENOSCOPY  07/21/09   schatzis ring s/p dilation;small HH/mild erosive reflux/patent pylorus   ESOPHAGOGASTRODUODENOSCOPY  08/27/12   3 cm hiatal hernia/gastric polyp, bx fundic gland polyp   EYE SURGERY     TONSILLECTOMY     TUBAL LIGATION        Current Outpatient Medications:  aspirin 81 MG tablet, Take 81 mg by mouth daily., Disp: , Rfl:    diltiazem (TIAZAC) 240 MG 24 hr capsule, Take 240 mg by mouth daily., Disp: , Rfl:    montelukast (SINGULAIR) 10 MG tablet, Take 10 mg by mouth at bedtime. , Disp: , Rfl:    nitroGLYCERIN (NITROSTAT) 0.4 MG SL tablet, Place 1 tablet under the tongue every 5 (five) minutes x 3 doses as needed for chest pain., Disp: , Rfl:    olmesartan-hydrochlorothiazide (BENICAR HCT) 40-25 MG tablet, Take 1 tablet by mouth daily., Disp: , Rfl:    pantoprazole (PROTONIX) 40 MG tablet, Take 40 mg by mouth daily., Disp: , Rfl:    PROAIR HFA 108 (90 BASE) MCG/ACT inhaler, Inhale 1 puff into the lungs every 4 (four) hours as needed. Shortness of Breath, Disp: , Rfl:    SYMBICORT 160-4.5  MCG/ACT inhaler, INHALE 2 PUFFS INTO THE LUNGS FIRST THING EACH MORNING THEN ANOTHER 2PUFFS ABOUT 12 HOURS LATER., Disp: 10.2 g, Rfl: 11   acetaminophen (TYLENOL) 500 MG tablet, Take 500 mg by mouth every 6 (six) hours as needed for pain., Disp: , Rfl:    amLODipine (NORVASC) 5 MG tablet, Take 10 mg by mouth daily.  (Patient not taking: Reported on 01/23/2022), Disp: , Rfl:    clonazePAM (KLONOPIN) 0.5 MG tablet, Take 0.5 mg by mouth 2 (two) times daily as needed for anxiety. Sleep, Disp: , Rfl:    ipratropium-albuterol (DUONEB) 0.5-2.5 (3) MG/3ML SOLN, Take 3 mLs by nebulization every 6 (six) hours as needed. (Patient not taking: Reported on 01/23/2022), Disp: , Rfl:    pantoprazole (PROTONIX) 20 MG tablet, Take 20 mg by mouth daily. (Patient not taking: Reported on 01/23/2022), Disp: , Rfl:     Physical Exam: Blood pressure (!) 150/92, pulse 80, height 5\' 2"  (1.575 m), weight 139 lb (63 kg), SpO2 95 %.    Affect appropriate Healthy:  appears stated age 8: normal Neck supple with no adenopathy JVP normal no bruits no thyromegaly Lungs clear with no wheezing and good diaphragmatic motion Heart:  S1/S2 no murmur, no rub, gallop or click PMI normal Abdomen: benighn, BS positve, no tenderness, no AAA no bruit.  No HSM or HJR Distal pulses intact with no bruits No edema Neuro non-focal Skin warm and dry No muscular weakness   Labs:   Lab Results  Component Value Date   WBC 7.0 01/07/2015   HGB 13.9 01/07/2015   HCT 41.9 01/07/2015   MCV 85 01/07/2015   PLT 283 01/07/2015   No results for input(s): NA, K, CL, CO2, BUN, CREATININE, CALCIUM, PROT, BILITOT, ALKPHOS, ALT, AST, GLUCOSE in the last 168 hours.  Invalid input(s): LABALBU Lab Results  Component Value Date   CKTOTAL 36 05/02/2009   CKMB 0.9 05/02/2009   TROPONINI 0.01        NO INDICATION OF MYOCARDIAL INJURY. 05/02/2009    Lab Results  Component Value Date   CHOL  05/02/2009    180        ATP III  CLASSIFICATION:  <200     mg/dL   Desirable  200-239  mg/dL   Borderline High  >=240    mg/dL   High          Lab Results  Component Value Date   HDL 31 (L) 05/02/2009   Lab Results  Component Value Date   LDLCALC (H) 05/02/2009    123        Total Cholesterol/HDL:CHD Risk  Coronary Heart Disease Risk Table                     Men   Women  1/2 Average Risk   3.4   3.3  Average Risk       5.0   4.4  2 X Average Risk   9.6   7.1  3 X Average Risk  23.4   11.0        Use the calculated Patient Ratio above and the CHD Risk Table to determine the patient's CHD Risk.        ATP III CLASSIFICATION (LDL):  <100     mg/dL   Optimal  100-129  mg/dL   Near or Above                    Optimal  130-159  mg/dL   Borderline  160-189  mg/dL   High  >190     mg/dL   Very High   Lab Results  Component Value Date   TRIG 130 05/02/2009   Lab Results  Component Value Date   CHOLHDL 5.8 05/02/2009   No results found for: LDLDIRECT    Radiology: No results found.  EKG: 01/23/2022 SR rate 80 voltage LVH nonspecific ST changes    ASSESSMENT AND PLAN:   Chest Pain:  intermediate risk with family history CAD, HTN, HLD and smoking Shared decision making feel cardiac CTA best risk stratifying test.  Smoking:  counseled on cessation uses singulair Duoneb and symbicort Can evaluate lung fields on cardiac CT GERD:  continue protonix may be related to pains HTN:  continue norvasc  Lipids:  not on statin no recent labs primary Advice after seeing calcium score  Lopressor 100 mg 2 hours before CTA BMET Coronary CTA  F/U PRN pending results   Signed: Jenkins Rouge 01/23/2022, 1:05 PM

## 2022-01-23 ENCOUNTER — Ambulatory Visit: Payer: Medicare Other | Admitting: Cardiovascular Disease

## 2022-01-23 ENCOUNTER — Other Ambulatory Visit: Payer: Self-pay

## 2022-01-23 ENCOUNTER — Encounter: Payer: Self-pay | Admitting: Cardiovascular Disease

## 2022-01-23 VITALS — BP 150/92 | HR 80 | Ht 62.0 in | Wt 139.0 lb

## 2022-01-23 DIAGNOSIS — R072 Precordial pain: Secondary | ICD-10-CM | POA: Diagnosis not present

## 2022-01-23 DIAGNOSIS — R0789 Other chest pain: Secondary | ICD-10-CM

## 2022-01-23 DIAGNOSIS — F17210 Nicotine dependence, cigarettes, uncomplicated: Secondary | ICD-10-CM

## 2022-01-23 MED ORDER — METOPROLOL TARTRATE 100 MG PO TABS: ORAL_TABLET | ORAL | 0 refills | Status: AC

## 2022-01-23 NOTE — Patient Instructions (Addendum)
Medication Instructions:  *If you need a refill on your cardiac medications before your next appointment, please call your pharmacy*  Lab Work: Your physician recommends that you have lab work today- BMET  If you have labs (blood work) drawn today and your tests are completely normal, you will receive your results only by: MyChart Message (if you have MyChart) OR A paper copy in the mail If you have any lab test that is abnormal or we need to change your treatment, we will call you to review the results.  Testing/Procedures: Your physician has requested that you have cardiac CT. Cardiac computed tomography (CT) is a painless test that uses an x-ray machine to take clear, detailed pictures of your heart. For further information please visit [email or web address]. Please follow instruction sheet as given.  Follow-Up: At [facility], you and your health needs are our priority.  As part of our continuing mission to provide you with exceptional heart care, we have created designated Provider Care Teams.  These Care Teams include your primary Cardiologist (physician) and Advanced Practice Providers (APPs -  Physician Assistants and Nurse Practitioners) who all work together to provide you with the care you need, when you need it.  We recommend signing up for the patient portal called "MyChart".  Sign up information is provided on this After Visit Summary.  MyChart is used to connect with patients for Virtual Visits (Telemedicine).  Patients are able to view lab/test results, encounter notes, upcoming appointments, etc.  Non-urgent messages can be sent to your provider as well.   To learn more about what you can do with MyChart, go to [email or web address].    Your next appointment:   As needed in [place]  The format for your next appointment:   In Person  Provider:   Jenkins Rouge, MD {    Your cardiac CT will be scheduled at one of the below locations:   [facility] [address] [place], [place] [address] [phone]  If scheduled at [facility], please arrive at the [facility] main entrance (entrance A) of [facility] 30 minutes prior to test start time. You can use the FREE valet parking offered at the main entrance (encouraged to control the heart rate for the test) Proceed to the [facility] Radiology Department (first floor) to check-in and test prep.  Please follow these instructions carefully (unless otherwise directed):  On the Night Before the Test: Be sure to Drink plenty of water. Do not consume any caffeinated/decaffeinated beverages or chocolate 12 hours prior to your test. Do not take any antihistamines 12 hours prior to your test.  On the Day of the Test: Drink plenty of water until 1 hour prior to the test. Do not eat any food 4 hours prior to the test. You may take your regular medications prior to the test.  Take metoprolol (Lopressor) 100 mg two hours prior to test. Hydrochlorothiazide morning of the test. FEMALES- please wear underwire-free bra if available, avoid dresses & tight clothing     After the Test: Drink plenty of water. After receiving IV contrast, you may experience a mild flushed feeling. This is normal. On occasion, you may experience a mild rash up to 24 hours after the test. This is not dangerous. If this occurs, you can take Benadryl 25 mg and increase your fluid intake. If you experience trouble breathing, this can be serious. If it is severe call 911 IMMEDIATELY. If it is mild, please call our  office.  We will call to schedule your test 2-4 weeks out understanding that some insurance companies will need an authorization prior to the service being performed.   For non-scheduling related questions, please contact the cardiac imaging nurse navigator should you have any questions/concerns: Marchia Bond, Cardiac Imaging Nurse Navigator Gordy Clement, Cardiac Imaging Nurse Navigator [place]  [facility] Direct Office Dial: [phone]   For scheduling needs, including cancellations and rescheduling, please call [place], [phone].

## 2022-01-24 LAB — BASIC METABOLIC PANEL
BUN/Creatinine Ratio: 19 (ref 12–28)
BUN: 18 mg/dL (ref 8–27)
CO2: 25 mmol/L (ref 20–29)
Calcium: 9.7 mg/dL (ref 8.7–10.3)
Chloride: 101 mmol/L (ref 96–106)
Creatinine, Ser: 0.97 mg/dL (ref 0.57–1.00)
Glucose: 134 mg/dL — ABNORMAL HIGH (ref 70–99)
Potassium: 3.5 mmol/L (ref 3.5–5.2)
Sodium: 142 mmol/L (ref 134–144)
eGFR: 64 mL/min/{1.73_m2} (ref >59)

## 2022-02-01 ENCOUNTER — Telehealth (HOSPITAL_COMMUNITY): Payer: Self-pay | Admitting: *Deleted

## 2022-02-01 NOTE — Telephone Encounter (Signed)
Attempted to call patient regarding upcoming cardiac CT appointment. °Left message on voicemail with name and callback number ° °Azharia Surratt RN Navigator Cardiac Imaging °[facility] °[phone] Office °[phone] Cell ° °

## 2022-02-01 NOTE — Telephone Encounter (Signed)
Patient returning call regarding upcoming cardiac imaging study; pt verbalizes understanding of appt date/time, parking situation and where to check in, pre-test NPO status and medications ordered, and verified current allergies; name and call back number provided for further questions should they arise  Gordy Clement RN Navigator Cardiac Imaging [facility] [phone] office [phone] cell  Patient to take 100mg  metoprolol tartrate two hours prior to her cardiac CT scan.  She is aware to arrive at 8:30am for her 9am scan.

## 2022-02-03 ENCOUNTER — Ambulatory Visit (HOSPITAL_COMMUNITY)
Admission: RE | Admit: 2022-02-03 | Discharge: 2022-02-03 | Disposition: A | Discharge status: Home / Self Care | Payer: Medicare Other | Source: Ambulatory Visit | Attending: Cardiovascular Disease | Admitting: Cardiovascular Disease

## 2022-02-03 ENCOUNTER — Other Ambulatory Visit: Payer: Self-pay

## 2022-02-03 ENCOUNTER — Telehealth: Payer: Self-pay | Admitting: Cardiovascular Disease

## 2022-02-03 DIAGNOSIS — R072 Precordial pain: Secondary | ICD-10-CM | POA: Insufficient documentation

## 2022-02-03 MED ORDER — NITROGLYCERIN 0.4 MG SL SUBL
SUBLINGUAL_TABLET | SUBLINGUAL | Status: AC
  Filled 2022-02-03: qty 2

## 2022-02-03 MED ORDER — NITROGLYCERIN 0.4 MG SL SUBL
0.8000 mg | SUBLINGUAL_TABLET | Freq: Once | SUBLINGUAL | Status: AC
  Administered 2022-02-03: 0.8 mg via SUBLINGUAL

## 2022-02-03 MED ORDER — IOHEXOL 350 MG/ML SOLN
100.0000 mL | Freq: Once | INTRAVENOUS | Status: AC | PRN
  Administered 2022-02-03: 100 mL via INTRAVENOUS

## 2022-02-03 NOTE — Telephone Encounter (Signed)
Josue Hector, MD  [employer], Marikay Alar, LPN Cc: Lorraine Wells Last, CMA No obstructive CAD calcium score high for age needs lipid/liver with primary and start statin            Patient notified of CT results, will f/u with Dr.Golding to potentially start statin

## 2022-02-03 NOTE — Telephone Encounter (Signed)
Follow Up:       Patient is returning Cathy's call, concerning her test results.

## 2022-08-02 ENCOUNTER — Encounter: Payer: Self-pay | Admitting: *Deleted

## 2022-08-09 ENCOUNTER — Encounter: Payer: Self-pay | Admitting: *Deleted

## 2022-09-12 ENCOUNTER — Encounter (HOSPITAL_COMMUNITY): Payer: Self-pay | Admitting: Emergency Medicine

## 2022-09-12 ENCOUNTER — Emergency Department (HOSPITAL_COMMUNITY)
Admission: EM | Admit: 2022-09-12 | Discharge: 2022-09-12 | Disposition: A | Discharge status: Home / Self Care | Payer: Medicare Other | Attending: Emergency Medicine | Admitting: Emergency Medicine

## 2022-09-12 ENCOUNTER — Emergency Department (HOSPITAL_COMMUNITY): Payer: Medicare Other

## 2022-09-12 ENCOUNTER — Other Ambulatory Visit: Payer: Self-pay

## 2022-09-12 DIAGNOSIS — Z79899 Other long term (current) drug therapy: Secondary | ICD-10-CM | POA: Insufficient documentation

## 2022-09-12 DIAGNOSIS — J441 Chronic obstructive pulmonary disease with (acute) exacerbation: Secondary | ICD-10-CM

## 2022-09-12 DIAGNOSIS — R0602 Shortness of breath: Secondary | ICD-10-CM | POA: Diagnosis present

## 2022-09-12 DIAGNOSIS — Z7982 Long term (current) use of aspirin: Secondary | ICD-10-CM | POA: Diagnosis not present

## 2022-09-12 HISTORY — DX: Chronic obstructive pulmonary disease, unspecified: J44.9

## 2022-09-12 LAB — BASIC METABOLIC PANEL
Anion gap: 10 (ref 5–15)
BUN: 15 mg/dL (ref 8–23)
CO2: 25 mmol/L (ref 22–32)
Calcium: 9.2 mg/dL (ref 8.9–10.3)
Chloride: 103 mmol/L (ref 98–111)
Creatinine, Ser: 0.86 mg/dL (ref 0.44–1.00)
GFR, Estimated: >60 mL/min (ref >60)
Glucose, Bld: 118 mg/dL — ABNORMAL HIGH (ref 70–99)
Potassium: 3.3 mmol/L — ABNORMAL LOW (ref 3.5–5.1)
Sodium: 138 mmol/L (ref 135–145)

## 2022-09-12 LAB — CBC WITH DIFFERENTIAL/PLATELET
Abs Immature Granulocytes: 0.03 10*3/uL (ref 0.00–0.07)
Basophils Absolute: 0.1 10*3/uL (ref 0.0–0.1)
Basophils Relative: 2 %
Eosinophils Absolute: 0.3 10*3/uL (ref 0.0–0.5)
Eosinophils Relative: 4 %
HCT: 44.7 % (ref 36.0–46.0)
Hemoglobin: 14.6 g/dL (ref 12.0–15.0)
Immature Granulocytes: 0 %
Lymphocytes Relative: 23 %
Lymphs Abs: 1.9 10*3/uL (ref 0.7–4.0)
MCH: 27 pg (ref 26.0–34.0)
MCHC: 32.7 g/dL (ref 30.0–36.0)
MCV: 82.6 fL (ref 80.0–100.0)
Monocytes Absolute: 0.4 10*3/uL (ref 0.1–1.0)
Monocytes Relative: 4 %
Neutro Abs: 5.4 10*3/uL (ref 1.7–7.7)
Neutrophils Relative %: 67 %
Platelets: 313 10*3/uL (ref 150–400)
RBC: 5.41 MIL/uL — ABNORMAL HIGH (ref 3.87–5.11)
RDW: 14.6 % (ref 11.5–15.5)
WBC: 8.1 10*3/uL (ref 4.0–10.5)
nRBC: 0 % (ref 0.0–0.2)

## 2022-09-12 MED ORDER — PREDNISONE 20 MG PO TABS: 40.0000 mg | ORAL_TABLET | Freq: Every day | ORAL | 0 refills | Status: AC

## 2022-09-12 MED ORDER — AZITHROMYCIN 500 MG PO TABS: 500.0000 mg | ORAL_TABLET | Freq: Every day | ORAL | 0 refills | Status: AC

## 2022-09-12 MED ORDER — ONDANSETRON 4 MG PO TBDP
4.0000 mg | ORAL_TABLET | Freq: Once | ORAL | Status: AC | PRN
  Administered 2022-09-12: 4 mg via ORAL
  Filled 2022-09-12: qty 1

## 2022-09-12 MED ORDER — METHYLPREDNISOLONE SODIUM SUCC 125 MG IJ SOLR
60.0000 mg | Freq: Once | INTRAMUSCULAR | Status: AC
  Administered 2022-09-12: 60 mg via INTRAVENOUS
  Filled 2022-09-12: qty 2

## 2022-09-12 MED ORDER — IPRATROPIUM-ALBUTEROL 0.5-2.5 (3) MG/3ML IN SOLN
3.0000 mL | RESPIRATORY_TRACT | Status: AC
  Administered 2022-09-12 (×3): 3 mL via RESPIRATORY_TRACT
  Filled 2022-09-12: qty 9

## 2022-09-12 MED ORDER — POTASSIUM CHLORIDE 20 MEQ PO PACK
40.0000 meq | PACK | Freq: Two times a day (BID) | ORAL | Status: DC
  Administered 2022-09-12: 40 meq via ORAL
  Filled 2022-09-12: qty 2

## 2022-09-12 NOTE — ED Notes (Signed)
Patient transported to X-ray 

## 2022-09-12 NOTE — ED Provider Notes (Signed)
[facility] EMERGENCY DEPARTMENT Provider Note   CSN: [id number] Arrival date & time: 09/12/22  4580     History  Chief Complaint  Patient presents with   Shortness of Breath    Lorraine Wells is a 66 y.o. female with COPD, GERD, chronic constipation, dysphagia presents with SOB.   Pt c/o chest tightness and SOB x 4 days consistent with prior COPD exacerbations. Improved by inhalers typically but not today. Denies f/c, recent illnesses, lightheadedness, N/V, abd pain, chest "pain," leg swelling. +Increased cough, sputum production that is white, no hemoptysis.    Shortness of Breath      Home Medications Prior to Admission medications   Medication Sig Start Date End Date Taking? Authorizing Provider  azithromycin (ZITHROMAX) 500 MG tablet Take 1 tablet (500 mg total) by mouth daily for 5 days. Take first 2 tablets together, then 1 every day until finished. 09/12/22 09/17/22 Yes Audley Hose, MD  predniSONE (DELTASONE) 20 MG tablet Take 2 tablets (40 mg total) by mouth daily for 5 days. 09/12/22 09/17/22 Yes Audley Hose, MD  aspirin 81 MG tablet Take 81 mg by mouth daily.    [provider]  budesonide-formoterol (SYMBICORT) 80-4.5 MCG/ACT inhaler Inhale 2 puffs into the lungs 2 (two) times daily. 09/01/22   [provider]  diltiazem (CARDIZEM CD) 240 MG 24 hr capsule Take 240 mg by mouth at bedtime. 09/07/22   [provider]  metoprolol tartrate (LOPRESSOR) 100 MG tablet Take one tablet by mouth 2 hours prior to your CT Test 01/23/22   Josue Hector, MD  montelukast (SINGULAIR) 10 MG tablet Take 10 mg by mouth at bedtime.  07/18/12   [provider]  nitroGLYCERIN (NITROSTAT) 0.4 MG SL tablet Place 1 tablet under the tongue every 5 (five) minutes x 3 doses as needed for chest pain. 01/19/22   [provider]  olmesartan-hydrochlorothiazide (BENICAR HCT) 40-25 MG tablet Take 1 tablet by mouth daily. 01/16/22   [provider]   pantoprazole (PROTONIX) 40 MG tablet Take 40 mg by mouth daily.    [provider]  PROAIR HFA 108 (90 BASE) MCG/ACT inhaler Inhale 1 puff into the lungs every 4 (four) hours as needed. Shortness of Breath 08/02/12   [provider]  pramipexole (MIRAPEX) 0.125 MG tablet Take 1 pill each night for 1 week, the 2 pills each night for 1 week, then 3 pills each night thereafter. Patient taking differently: Take 0.125 mg by mouth at bedtime. 01/08/15 07/07/20  Star Age, MD      Allergies    Seasonal ic [cholestatin] and Levonorgestrel-ethinyl estrad    Review of Systems   Review of Systems  Respiratory:  Positive for shortness of breath.    Review of systems negative for f/c.  A 10 point review of systems was performed and is negative unless otherwise reported in HPI.  Physical Exam Updated Vital Signs BP (!) 176/92   Pulse 69   Temp 97.7 F (36.5 C) (Oral)   Resp 18   Ht '5\' 2"'$  (1.575 m)   Wt 64.2 kg   SpO2 100%   BMI 25.88 kg/m  Physical Exam General: Normal appearing female, lying in bed.  HEENT: Sclera anicteric, MMM, trachea midline. Cardiology: RRR, no murmurs/rubs/gallops. BL radial and DP pulses equal bilaterally.  Resp: No increased WOB. Normal respiratory rate and effort. Expiratory wheezing bilaterally but with good air movement. Intermittent cough. CTAB, no rhonchi, crackles.  Abd: Soft, non-tender, non-distended. No  rebound tenderness or guarding.  MSK: No peripheral edema or signs of trauma. Extremities without deformity or TTP. No cyanosis or clubbing. Skin: warm, dry. No rashes or lesions. Neuro: A&Ox4, CNs II-XII grossly intact. MAEs. Sensation grossly intact.  Psych: Normal mood and affect.   ED Results / Procedures / Treatments   Labs (all labs ordered are listed, but only abnormal results are displayed) Labs Reviewed  BASIC METABOLIC PANEL - Abnormal; Notable for the following components:      Result Value   Potassium 3.3 (*)    Glucose,  Bld 118 (*)    All other components within normal limits  CBC WITH DIFFERENTIAL/PLATELET - Abnormal; Notable for the following components:   RBC 5.41 (*)    All other components within normal limits    EKG EKG Interpretation  Date/Time:  Tuesday September 12 2022 10:39:49 EDT Ventricular Rate:  61 PR Interval:  201 QRS Duration: 82 QT Interval:  491 QTC Calculation: 495 R Axis:   48 Text Interpretation: Sinus rhythm Borderline prolonged QT interval Confirmed by Cindee Lame [phone]) on 09/12/2022 11:57:32 AM  Radiology DG Chest 2 View  Result Date: 09/12/2022 CLINICAL DATA:  COPD with increased sputum. Shortness of breath for 4 days EXAM: CHEST - 2 VIEW COMPARISON:  Chest radiograph 04/05/2015 FINDINGS: No pleural effusion. No pneumothorax. Bibasilar atelectasis. Normal cardiac and mediastinal contours. Visualized upper abdomen is unremarkable. No acute osseous abnormality. IMPRESSION: No active cardiopulmonary disease. Electronically Signed   By: Marin Roberts M.D.   On: 09/12/2022 11:02    Procedures Procedures    Medications Ordered in ED Medications  potassium chloride (KLOR-CON) packet 40 mEq (40 mEq Oral Given 09/12/22 1155)  ipratropium-albuterol (DUONEB) 0.5-2.5 (3) MG/3ML nebulizer solution 3 mL (3 mLs Nebulization Given 09/12/22 1125)  methylPREDNISolone sodium succinate (SOLU-MEDROL) 125 mg/2 mL injection 60 mg (60 mg Intravenous Given 09/12/22 1030)  ondansetron (ZOFRAN-ODT) disintegrating tablet 4 mg (4 mg Oral Given 09/12/22 1031)    ED Course/ Medical Decision Making/ A&P                          Medical Decision Making Amount and/or Complexity of Data Reviewed Labs: ordered. Decision-making details documented in ED Course. Radiology: ordered. Decision-making details documented in ED Course.  Risk Prescription drug management.   Patient is moderately hypertensive to 323F systolic. Afebrile, SORA. No increased WOB but with wheezing.   DDX for this patient's  dyspnea most likely represents COPD exacerbation vs pneumonia. Less likely ACS/arrhythmia, though will obtain EKG. Patient has no orthopnea or leg swelling or crackles to suggest CHF or murmurs to suggest valvular heart disease. Considered PE but with no leg swelling, no hypoxia or tachycardia and with known COPD do not believe it is most likely diagnosis.   Lab/imaging w/u shows no leukocytosis, PNA, PTX, or pulm edema/pleural effusion.    I have personally reviewed and interpreted all labs and imaging.   Clinical Course as of 09/12/22 1158  Tue Sep 12, 2022  1106 DG Chest 2 View No active cardiopulmonary disease. [HN]  1115 WBC: 8.1 No leukocytosis [HN]  1115 Potassium(!): 3.3 [HN]  1115 We will replete [HN]    Clinical Course User Index [HN] Audley Hose, MD   Patient is given methylprednisolone 60 mg IV and 3 duonebs. Patient reevaluated. She has no more wheezing and states she feels much improved and would like to go home. Will DC patient with course of prednisone as well  as abx.   Patient is also informed of her hypertension and instructed to discuss with PCP about adjusting her antihypertensive regimen.   Dispo: DC with discharge instructions, return precautions, instructions to f/u with PCP within 1 week.         Final Clinical Impression(s) / ED Diagnoses Final diagnoses:  COPD exacerbation ([place])    Rx / DC Orders ED Discharge Orders          Ordered    predniSONE (DELTASONE) 20 MG tablet  Daily        09/12/22 1156    azithromycin (ZITHROMAX) 500 MG tablet  Daily        09/12/22 1156             This note was created using dictation software, which may contain spelling or grammatical errors.    Audley Hose, MD 09/12/22 1158

## 2022-09-12 NOTE — ED Triage Notes (Signed)
Pt c/o chest tightness and Sob x 4 days. Denies lightheadedness, denied N/V. Hx COPD

## 2022-09-12 NOTE — Discharge Instructions (Addendum)
Thank you for coming to [facility] Emergency Department. You were seen for shortness of breath. We did an exam, labs, and imaging, and these showed that you are likely having a COPD exacerbation.   We will discharge you with a prescription for prednisone 40 mg daily for 5 days and azithromycin 500 mg daily for 5 days. Please follow up with your primary care provider within 1 week. Please also discuss your hypertension and possibly adjusting your medication.  Do not hesitate to return to the ED or call 911 if you experience: -Worsening symptoms -Lightheadedness, passing out -Fevers/chills -Anything else that concerns you

## 2022-11-03 ENCOUNTER — Other Ambulatory Visit: Payer: Self-pay | Admitting: Family Medicine

## 2022-11-03 DIAGNOSIS — Z1231 Encounter for screening mammogram for malignant neoplasm of breast: Secondary | ICD-10-CM

## 2022-11-10 ENCOUNTER — Ambulatory Visit
Admission: RE | Admit: 2022-11-10 | Discharge: 2022-11-10 | Disposition: A | Discharge status: Home / Self Care | Payer: Medicare Other | Source: Ambulatory Visit | Attending: Family Medicine | Admitting: Family Medicine

## 2022-11-10 DIAGNOSIS — Z1231 Encounter for screening mammogram for malignant neoplasm of breast: Secondary | ICD-10-CM

## 2023-01-29 ENCOUNTER — Encounter: Payer: Self-pay | Admitting: *Deleted

## 2023-10-08 ENCOUNTER — Other Ambulatory Visit (HOSPITAL_COMMUNITY): Payer: Self-pay | Admitting: Family Medicine

## 2023-10-08 DIAGNOSIS — J441 Chronic obstructive pulmonary disease with (acute) exacerbation: Secondary | ICD-10-CM

## 2023-10-08 DIAGNOSIS — F17200 Nicotine dependence, unspecified, uncomplicated: Secondary | ICD-10-CM

## 2023-11-19 ENCOUNTER — Ambulatory Visit (HOSPITAL_COMMUNITY): Admission: RE | Admit: 2023-11-19 | Payer: Medicare Other | Source: Ambulatory Visit

## 2023-11-19 ENCOUNTER — Encounter (HOSPITAL_COMMUNITY): Payer: Self-pay

## 2023-11-20 ENCOUNTER — Ambulatory Visit (HOSPITAL_COMMUNITY): Admission: RE | Admit: 2023-11-20 | Payer: Medicare Other | Source: Ambulatory Visit

## 2023-11-30 ENCOUNTER — Ambulatory Visit (HOSPITAL_COMMUNITY)
Admission: RE | Admit: 2023-11-30 | Discharge: 2023-11-30 | Disposition: A | Discharge status: Home / Self Care | Payer: Medicare Other | Source: Ambulatory Visit | Attending: Family Medicine | Admitting: Family Medicine

## 2023-11-30 DIAGNOSIS — Z122 Encounter for screening for malignant neoplasm of respiratory organs: Secondary | ICD-10-CM | POA: Insufficient documentation

## 2023-11-30 DIAGNOSIS — J441 Chronic obstructive pulmonary disease with (acute) exacerbation: Secondary | ICD-10-CM | POA: Insufficient documentation

## 2023-11-30 DIAGNOSIS — N2889 Other specified disorders of kidney and ureter: Secondary | ICD-10-CM | POA: Insufficient documentation

## 2023-11-30 DIAGNOSIS — F17210 Nicotine dependence, cigarettes, uncomplicated: Secondary | ICD-10-CM | POA: Diagnosis not present

## 2023-11-30 DIAGNOSIS — F17200 Nicotine dependence, unspecified, uncomplicated: Secondary | ICD-10-CM | POA: Insufficient documentation

## 2023-11-30 DIAGNOSIS — I700 Atherosclerosis of aorta: Secondary | ICD-10-CM | POA: Insufficient documentation

## 2023-12-17 ENCOUNTER — Other Ambulatory Visit (HOSPITAL_COMMUNITY): Payer: Self-pay | Admitting: Family Medicine

## 2023-12-17 DIAGNOSIS — D4100 Neoplasm of uncertain behavior of unspecified kidney: Secondary | ICD-10-CM

## 2023-12-22 ENCOUNTER — Ambulatory Visit (HOSPITAL_COMMUNITY)
Admission: RE | Admit: 2023-12-22 | Discharge: 2023-12-22 | Disposition: A | Discharge status: Home / Self Care | Payer: Medicare Other | Source: Ambulatory Visit | Attending: Family Medicine | Admitting: Family Medicine

## 2023-12-22 DIAGNOSIS — D4120 Neoplasm of uncertain behavior of unspecified ureter: Secondary | ICD-10-CM | POA: Diagnosis present

## 2023-12-22 DIAGNOSIS — D4100 Neoplasm of uncertain behavior of unspecified kidney: Secondary | ICD-10-CM | POA: Insufficient documentation

## 2023-12-22 MED ORDER — GADOBUTROL 1 MMOL/ML IV SOLN
6.0000 mL | Freq: Once | INTRAVENOUS | Status: AC | PRN
  Administered 2023-12-22: 6 mL via INTRAVENOUS

## 2023-12-28 ENCOUNTER — Ambulatory Visit: Payer: Medicare Other | Admitting: Urology

## 2023-12-28 DIAGNOSIS — N289 Disorder of kidney and ureter, unspecified: Secondary | ICD-10-CM

## 2024-12-12 ENCOUNTER — Encounter (HOSPITAL_COMMUNITY): Payer: Self-pay | Admitting: Family Medicine
